# Patient Record
Sex: Female | Born: 1970
Health system: Southern US, Community
[De-identification: ages and names within clinical notes are randomized; demographics above are authoritative.]

## PROBLEM LIST (undated history)

## (undated) DIAGNOSIS — T7840XA Allergy, unspecified, initial encounter: Secondary | ICD-10-CM

## (undated) DIAGNOSIS — K649 Unspecified hemorrhoids: Secondary | ICD-10-CM

## (undated) DIAGNOSIS — F419 Anxiety disorder, unspecified: Secondary | ICD-10-CM

## (undated) DIAGNOSIS — D126 Benign neoplasm of colon, unspecified: Secondary | ICD-10-CM

## (undated) DIAGNOSIS — R002 Palpitations: Secondary | ICD-10-CM

## (undated) HISTORY — PX: POLYPECTOMY: SHX149

## (undated) HISTORY — DX: Benign neoplasm of colon, unspecified: D12.6

## (undated) HISTORY — DX: Palpitations: R00.2

## (undated) HISTORY — PX: COLONOSCOPY: SHX174

## (undated) HISTORY — PX: BREAST BIOPSY: SHX20

## (undated) HISTORY — DX: Allergy, unspecified, initial encounter: T78.40XA

## (undated) HISTORY — PX: HEMORRHOID SURGERY: SHX153

## (undated) HISTORY — DX: Unspecified hemorrhoids: K64.9

## (undated) HISTORY — DX: Anxiety disorder, unspecified: F41.9

## (undated) HISTORY — PX: HEMORROIDECTOMY: SUR656

## (undated) HISTORY — PX: CHOLECYSTECTOMY: SHX55

---

## 2001-05-08 ENCOUNTER — Other Ambulatory Visit: Admission: RE | Admit: 2001-05-08 | Discharge: 2001-05-08 | Payer: Self-pay | Admitting: Obstetrics and Gynecology

## 2002-05-28 ENCOUNTER — Other Ambulatory Visit: Admission: RE | Admit: 2002-05-28 | Discharge: 2002-05-28 | Payer: Self-pay | Admitting: Obstetrics and Gynecology

## 2003-02-03 ENCOUNTER — Inpatient Hospital Stay (HOSPITAL_COMMUNITY): Admission: AD | Admit: 2003-02-03 | Discharge: 2003-02-06 | Payer: Self-pay | Admitting: Obstetrics and Gynecology

## 2003-02-03 ENCOUNTER — Encounter (INDEPENDENT_AMBULATORY_CARE_PROVIDER_SITE_OTHER): Payer: Self-pay

## 2003-02-07 ENCOUNTER — Encounter: Admission: RE | Admit: 2003-02-07 | Discharge: 2003-03-09 | Payer: Self-pay | Admitting: Obstetrics and Gynecology

## 2003-04-20 ENCOUNTER — Other Ambulatory Visit: Admission: RE | Admit: 2003-04-20 | Discharge: 2003-04-20 | Payer: Self-pay | Admitting: Obstetrics and Gynecology

## 2004-05-23 ENCOUNTER — Ambulatory Visit (HOSPITAL_COMMUNITY): Admission: RE | Admit: 2004-05-23 | Discharge: 2004-05-23 | Payer: Self-pay | Admitting: Obstetrics and Gynecology

## 2004-06-29 ENCOUNTER — Other Ambulatory Visit: Admission: RE | Admit: 2004-06-29 | Discharge: 2004-06-29 | Payer: Self-pay | Admitting: Obstetrics and Gynecology

## 2004-09-23 ENCOUNTER — Ambulatory Visit (HOSPITAL_COMMUNITY): Admission: RE | Admit: 2004-09-23 | Discharge: 2004-09-23 | Payer: Self-pay | Admitting: *Deleted

## 2004-09-23 ENCOUNTER — Ambulatory Visit (HOSPITAL_BASED_OUTPATIENT_CLINIC_OR_DEPARTMENT_OTHER): Admission: RE | Admit: 2004-09-23 | Discharge: 2004-09-23 | Payer: Self-pay | Admitting: *Deleted

## 2004-09-23 ENCOUNTER — Encounter (INDEPENDENT_AMBULATORY_CARE_PROVIDER_SITE_OTHER): Payer: Self-pay | Admitting: Specialist

## 2005-09-08 ENCOUNTER — Other Ambulatory Visit: Admission: RE | Admit: 2005-09-08 | Discharge: 2005-09-08 | Payer: Self-pay | Admitting: Obstetrics and Gynecology

## 2008-01-07 ENCOUNTER — Inpatient Hospital Stay (HOSPITAL_COMMUNITY): Admission: AD | Admit: 2008-01-07 | Discharge: 2008-01-09 | Payer: Self-pay | Admitting: Obstetrics & Gynecology

## 2008-01-08 ENCOUNTER — Encounter (INDEPENDENT_AMBULATORY_CARE_PROVIDER_SITE_OTHER): Payer: Self-pay | Admitting: Obstetrics & Gynecology

## 2008-01-09 ENCOUNTER — Encounter (INDEPENDENT_AMBULATORY_CARE_PROVIDER_SITE_OTHER): Payer: Self-pay | Admitting: Obstetrics and Gynecology

## 2008-02-03 ENCOUNTER — Ambulatory Visit (HOSPITAL_COMMUNITY): Admission: RE | Admit: 2008-02-03 | Discharge: 2008-02-03 | Payer: Self-pay | Admitting: Obstetrics and Gynecology

## 2009-02-17 ENCOUNTER — Inpatient Hospital Stay (HOSPITAL_COMMUNITY): Admission: AD | Admit: 2009-02-17 | Discharge: 2009-02-19 | Payer: Self-pay | Admitting: Obstetrics and Gynecology

## 2009-11-30 ENCOUNTER — Encounter: Admission: RE | Admit: 2009-11-30 | Discharge: 2009-11-30 | Payer: Self-pay | Admitting: Gastroenterology

## 2010-06-21 ENCOUNTER — Ambulatory Visit: Payer: Self-pay | Admitting: Family Medicine

## 2010-06-21 DIAGNOSIS — M461 Sacroiliitis, not elsewhere classified: Secondary | ICD-10-CM | POA: Insufficient documentation

## 2010-06-21 DIAGNOSIS — M2170 Unequal limb length (acquired), unspecified site: Secondary | ICD-10-CM | POA: Insufficient documentation

## 2010-06-21 DIAGNOSIS — R300 Dysuria: Secondary | ICD-10-CM | POA: Insufficient documentation

## 2010-06-21 LAB — CONVERTED CEMR LAB
Bilirubin Urine: NEGATIVE
Glucose, Urine, Semiquant: NEGATIVE
Ketones, urine, test strip: NEGATIVE
Nitrite: NEGATIVE
Protein, U semiquant: NEGATIVE
Specific Gravity, Urine: 1.025
Urobilinogen, UA: 0.2
pH: 6

## 2010-06-22 ENCOUNTER — Encounter: Payer: Self-pay | Admitting: Family Medicine

## 2010-06-24 ENCOUNTER — Encounter: Payer: Self-pay | Admitting: Family Medicine

## 2010-08-30 NOTE — Assessment & Plan Note (Signed)
Summary: POSS UTI/TJ (rm 4)   Vital Signs:  Patient Profile:   40 Years Old Female CC:      dysuria x 2days Height:     69 inches Weight:      131.50 pounds O2 Sat:      100 % O2 treatment:    Room Air Temp:     98.6 degrees F oral Pulse rate:   77 / minute BP sitting:   114 / 75  (left arm) Cuff size:   regular  Pt. in pain?   yes    Location:   lower back    Intensity:   7    Type:       sharp  Vitals Entered By: Lajean Saver RN (June 21, 2010 5:27 PM)                   Updated Prior Medication List: TYLENOL 325 MG TABS (ACETAMINOPHEN) PRN  Current Allergies: ! COMPAZINE ! PENICILLIN ! SULFAHistory of Present Illness Chief Complaint: dysuria x 2days History of Present Illness:  Subjective:  Patient complains of recurring non-radiating low back pain for eleven months.  She had an LS spine MRI in April that was negative.  In May she had two gallbladder attacks and an X-ray showed a gallstone.  Over the past week she has had several episodes of abdominal pain that increased her lower back pain.  She is scheduled to have a cholecystectomy in the near future.  She states that a pelvic exam in May was normal.  She has had no recent vaginal discharge or pelvic pain.  Over the past several days she has had mild dysuria.  She does not believe that she has had any fever. Patient to her right SI joint as a source of tenderness  REVIEW OF SYSTEMS Constitutional Symptoms      Denies fever, chills, night sweats, weight loss, weight gain, and fatigue.  Eyes       Denies change in vision, eye pain, eye discharge, glasses, contact lenses, and eye surgery. Ear/Nose/Throat/Mouth       Denies hearing loss/aids, change in hearing, ear pain, ear discharge, dizziness, frequent runny nose, frequent nose bleeds, sinus problems, sore throat, hoarseness, and tooth pain or bleeding.  Respiratory       Denies dry cough, productive cough, wheezing, shortness of breath, asthma, bronchitis,  and emphysema/COPD.  Cardiovascular       Denies murmurs, chest pain, and tires easily with exhertion.    Gastrointestinal       Denies stomach pain, nausea/vomiting, diarrhea, constipation, blood in bowel movements, and indigestion. Genitourniary       Complains of painful urination.      Denies kidney stones and loss of urinary control. Neurological       Denies paralysis, seizures, and fainting/blackouts. Musculoskeletal       Denies muscle pain, joint pain, joint stiffness, decreased range of motion, redness, swelling, muscle weakness, and gout.      Comments: low back pain Skin       Denies bruising, unusual mles/lumps or sores, and hair/skin or nail changes.  Psych       Denies mood changes, temper/anger issues, anxiety/stress, speech problems, depression, and sleep problems. Other Comments: pt c/o of low back pain and painful urination x 2 days. Pt states that she saw surgeon Dr Rayburn Ma last wk for Gallstones to be sch'ed for surgery.   Past History:  Past Medical History: Gallstone  Past Surgical History: DNC-  06/09' Caesarean section Hemorrhoidectomy  Family History: Family History of Arthritis Family History Diabetes 1st degree relative Family History Hypertension  Social History: Never Smoked Alcohol use-no Drug use-no Smoking Status:  never Drug Use:  no   Objective:  Appearance:  Patient appears healthy, stated age, and in no acute distress  Eyes:  Pupils are equal, round, and reactive to light and accomdation.  Extraocular movement is intact.  Conjunctivae are not inflamed.  Mouth:  No lesions Neck:  Supple.  No adenopathy is present.  No thyromegaly is present  Lungs:  Clear to auscultation.  Breath sounds are equal.  Heart:  Regular rate and rhythm without murmurs, rubs, or gallops.  Abdomen:  Nontender without masses or hepatosplenomegaly.  Bowel sounds are present.  No CVA or flank tenderness.   Back:   Good range of motion.  Can heel/toe walk and  squat without difficulty.  No tenderness over lower back.  Straight leg raising test is negative.  Sitting knee extension test is negative.  Strength and sensation in the lower extremities is normal.  Patellar and achilles reflexes are normal.  Extremities:  No edema.  Pedal pulses are full and equal.  No tenderness.  The left leg is approximately 1cm longer than the right by inspection    urinalysis (dipstick):  Trace leuks, trace blood                                                                               Assessment New Problems: UNEQUAL LEG LENGTH (ICD-736.81) SACROILIITIS, LEFT (ICD-720.2) DYSURIA (ICD-788.1) FAMILY HISTORY DIABETES 1ST DEGREE RELATIVE (ICD-V18.0)  BELIEVE THAT PATIENT'S BACK PAIN MAY BE A COMBINATION OF REFERRED PAIN FROM HER GALLBLADDER AND SACROILIAC INFLAMMATION.  Plan New Orders: T-Culture, Urine [[id number]] New Patient Level III [99203] Urinalysis [81003-65000] Planning Comments:   Obtain shoe insert for right shoe.  Begin stretching exercises for sacroiliac pain ([employer] information and instruction patient handout given)  Rule out UTI with urine culture Follow-up with surgeon as scheduled.   The patient and/or caregiver has been counseled thoroughly with regard to medications prescribed including dosage, schedule, interactions, rationale for use, and possible side effects and they verbalize understanding.  Diagnoses and expected course of recovery discussed and will return if not improved as expected or if the condition worsens. Patient and/or caregiver verbalized understanding.   Orders Added: 1)  T-Culture, Urine [[id number]] 2)  New Patient Level III [72536] 3)  Urinalysis [81003-65000]    Laboratory Results   Urine Tests  Date/Time Received: June 21, 2010 6:02 PM  Date/Time Reported: June 21, 2010 6:02 PM   Routine Urinalysis   Color: yellow Appearance: turbid Glucose: negative   (Normal Range: Negative) Bilirubin:  negative   (Normal Range: Negative) Ketone: negative   (Normal Range: Negative) Spec. Gravity: 1.025   (Normal Range: 1.003-1.035) Blood: trace-lysed   (Normal Range: Negative) pH: 6.0   (Normal Range: 5.0-8.0) Protein: negative   (Normal Range: Negative) Urobilinogen: 0.2   (Normal Range: 0-1) Nitrite: negative   (Normal Range: Negative) Leukocyte Esterace: trace   (Normal Range: Negative)

## 2010-08-30 NOTE — Assessment & Plan Note (Signed)
Summary: Followup Call  Notified patient of negative culture results; left message on cell phone Donna Christen MD  June 24, 2010 9:53 AM

## 2010-11-06 LAB — TYPE AND SCREEN
ABO/RH(D): A POS
Antibody Screen: NEGATIVE

## 2010-11-06 LAB — CBC
HCT: 33 % — ABNORMAL LOW (ref 36.0–46.0)
HCT: 36.7 % (ref 36.0–46.0)
Hemoglobin: 11.2 g/dL — ABNORMAL LOW (ref 12.0–15.0)
Hemoglobin: 12.9 g/dL (ref 12.0–15.0)
MCHC: 34.1 g/dL (ref 30.0–36.0)
MCHC: 35.3 g/dL (ref 30.0–36.0)
MCV: 92.2 fL (ref 78.0–100.0)
MCV: 93.3 fL (ref 78.0–100.0)
Platelets: 155 10*3/uL (ref 150–400)
Platelets: 171 10*3/uL (ref 150–400)
RBC: 3.53 MIL/uL — ABNORMAL LOW (ref 3.87–5.11)
RBC: 3.97 MIL/uL (ref 3.87–5.11)
RDW: 13.1 % (ref 11.5–15.5)
RDW: 13.5 % (ref 11.5–15.5)
WBC: 14.9 10*3/uL — ABNORMAL HIGH (ref 4.0–10.5)
WBC: 18 10*3/uL — ABNORMAL HIGH (ref 4.0–10.5)

## 2010-11-06 LAB — RPR: RPR Ser Ql: NONREACTIVE

## 2010-11-06 LAB — ABO/RH: ABO/RH(D): A POS

## 2010-11-06 LAB — GLUCOSE, CAPILLARY: Glucose-Capillary: 87 mg/dL (ref 70–99)

## 2010-12-13 NOTE — Op Note (Signed)
Heather Briggs, Heather Briggs NO.:  [id number]   MEDICAL RECORD NO.:  [id number]           PATIENT TYPE:   LOCATION:                                 FACILITY:   PHYSICIAN:  Duke Salvia. Marcelle Overlie, M.D.DATE OF BIRTH:  03/24/1971   DATE OF PROCEDURE:  01/09/2008  DATE OF DISCHARGE:                               OPERATIVE REPORT   PREOPERATIVE DIAGNOSES:  1. Retained placenta after 17-week spontaneous abortion.  2. Amnionitis.   POSTOPERATIVE DIAGNOSES:  1. Retained placenta after a 17-week spontaneous abortion.  2. Amnionitis.   PROCEDURE:  Dilation and evacuation.   SURGEON:  Duke Salvia. Marcelle Overlie, M.D.   ANESTHESIA:  Spinal.   COMPLICATIONS:  None.   DRAINS:  Foley catheter.   BLOOD LOSS:  300 mL including old blood clot.   SPECIMENS REMOVED:  Products of conception sent to pathology.   PROCEDURE AND FINDINGS:  The patient was taken to the operating room  after an adequate level of spinal anesthetic was obtained.  With the  patient's legs in stirrups, the bladder was drained.  The perineum was  prepped and draped.  A large amount of placenta was noted at the os and  was removed in one piece.  The cervix was amply dilated spontaneously.  Ring forceps were used to grasp the anterior cervical lip.  Uterus was  sounded.  Pitocin was already running at that point.  A #12 suction  curette was then used to curette a moderate amount of tissue.  When no  further tissue could be removed, an oversized curette was then used to  perform curettage revealing the walls to be clean.  There was minimal  bleeding at that point.  Pitocin was continued.  She was given IV  Toradol, and her antibiotics will be continued.  She went to recovery  room in good condition.      Richard M. Marcelle Overlie, M.D.  Electronically Signed     RMH/MEDQ  D:  01/09/2008  T:  01/09/2008  Job:  [id number]

## 2010-12-13 NOTE — Letter (Signed)
February 03, 2008    Michelle L. Vincente Poli, M.D.  [address], [address]  [place], [place] [address]   RE:   Heather Briggs, Heather Briggs  MR#   [id number]  ACC#        [id number]   MFM CONSULTATION REPORT   Dear Dr. Vincente Poli:   Thank you very much for allowing me to see Heather Briggs today in  consultation.  You know her as a 40 year old G3, P1-1-1-1 who is  referred for consultation secondary to her recent pregnancy on January 08, 2008 when she delivered at [redacted] weeks gestation after a febrile illness.  The patient reports that, during that pregnancy, she had complained of  headaches and just general lethargy over the weekend, and then noted a  fever of up to 104 on Monday, and was seen in the office on Tuesday, the  9th, whereby preterm premature ruptured membranes was suspected.  She  was admitted to the hospital and subsequently went on to labor and  deliver on January 08, 2008.  Her discharge revealed that there was  suspicion for premature ruptured membrane and possible amnionitis, and  possible incompetent cervix.  Her past medical history is otherwise  notable for a 37-week Cesarean delivery secondary to cephalopelvic  disproportion in 2004.   Available records from the hospitalization did not confirm any blood  culture or urine positive findings.  Placenta and fetus sent to  pathology.  However, I do not have the report from that final pathology  evaluation.  This is a preconceptional counseling session to address her  potential risks for subsequent pregnancies.   PAST MEDICAL HISTORY:  As noted above.   SURGICAL HISTORY:  Notable for Cesarean delivery, breast augmentation,  and dilatation and curettage for postpartum retained products in the  last pregnancy.   ALLERGIES:  PENICILLIN, SULFA, COMPAZINE, and ANALGESIC PROVIDED WITH  HER LAST DELIVERY.  That was possibly STADOL.   Her medications at this time are none.   PAST GYN HISTORY:  Notable for no STD but cryotherapy for abnormal Pap  smear  while she was in college.   OB HISTORY:  Notable for a full-term Cesarean delivery in 2004 for  cephalopelvic disproportion, and then a first trimester SAB in 2006,  which was a 5 to [redacted] week gestation, and this most recent delivery at 29-  weeks gestation.   SOCIAL HISTORY:  She denies any tobacco, alcohol, or drugs of abuse.  She works as a Clinical cytogeneticist.   FAMILY HISTORY:  Negative for any DES exposure or any other abnormal  history of other members with preterm birth.   PHYSICAL EXAMINATION:  VITAL SIGNS:  Her blood pressure is 136/85, pulse  68, and her weight is 147 pounds.  CONSTITUTIONAL:  She is alert and in no acute distress.  Emotionally,  she says she is dealing well with her most recent loss of the 17-week  pregnancy.   ASSESSMENT AND PLAN:  Preconceptional counseling for this patient who  has recently had a 17-week birth secondary to suspected chorioamnionitis  and PPROM.  1. Given this history of her 17-week preterm birth and her first      trimester spontaneous abortion, I did discuss the possibility of      having a hypercoagulable panel workup, including testing for      anticardiolipin antibodies as well as lupus anticoagulant panel.      In addition to those, we could also evaluate for any of the other  thrombophilia, both acquired as well as hereditary.  However, I      mentioned that the utility in these studies may be very minimal,      given the complexity of her prior presentation.  However, if she      elects to proceed with these studies, what I would recommend is IgG      and IgM for anticardiolipin antibodies, as well as lupus      anticoagulant panel, a protein C and a protein S level, and an      antithrombin 3, beta 2 glycoprotein, Factor V Leiden mutation, and      prothrombin 2 gene mutation studies.  2. Given her prior Cesarean delivery secondary to cephalopelvic      disproportion, there is a possibility that there may be a  component      of cervical insufficiency contributing to this most recent      presentation.  Thus, we had an extensive discussion related to that      possibility of cervical insufficiency and the potential benefit of      a prophylactic cerclage.  It is unclear whether this pregnancy fits      the criteria for cervical insufficiency, given that she had      presumed evidence of chorioamnionitis and subsequently PPROM and      then later labored to deliver.  Overall cervical length was      apparently normal during her hospitalization, per the patient      report.  However, if the patient and the primary physician feel      that prophylactic cerclage may be of benefit, then I would      recommend placing it at 13-weeks gestation.  3. Preterm birth.  Again, although this is not a clear indication for      17-hydroxyprogesterone caproate therapy, given that this is a pre-      viable preterm birth, most likely related to PPROM, one could      consider weekly administration of 17-hydroxyprogesterone in      subsequent pregnancies.  An alternative approach could be      monitoring for cervical length, and if there is shortening and/or      if there is a cerclage placed, with evidence of cervical      shortening, then a nightly vaginal suppository could be      administered.  4. Conception.  Discussed that shortened inter pregnancy intervals do      pose an increased risk for preterm birth in subsequent pregnancies.      Recommend that she consider delaying a pregnancy for at least 4 to      6 months, but this must be weighed against personal preference,      given potential risks related to advanced maternal age.   Answered all the questions that Heather Briggs had during this consultation  session.  Total face-to-face time was 40 minutes during this  consultation.  In summary, we discussed her prior pregnancy, loss at 17  weeks, which was assumed due to chorioamnionitis and ruptured  membranes.  Explained that, this gestation the likelihood of this recurring is  fairly low.  However, given her history of prior Cesarean delivery,  cervical insufficiency is not completely out of the question, and thus,  one option could be to follow serially with transvaginal cervical length  assessment and follow up with either progesterone or cerclage placement  if there is evidence of cervical shortening.  Additionally, if there is  other evidence of abruption, one could consider hypercoagulable panel,  as mentioned above.  Thank you very much for allowing me to participate  in the care of Heather Briggs.  If I can be of any further assistance, or if  you have any additional questions, please feel free to contact me.   Sincerely,      Toma Copier, MD  Assistant Professor, Maternal Fetal Medicine  [facility]  Electronically Signed     SJ/MEDQ  D:  02/03/2008  T:  02/03/2008  Job:  [id number]

## 2010-12-13 NOTE — H&P (Signed)
Heather Briggs, EPPING NO.:  [id number]   MEDICAL RECORD NO.:  [id number]          PATIENT TYPE:  INP   LOCATION:  9172                          FACILITY:  [facility]   PHYSICIAN:  Freddy Finner, M.D.   DATE OF BIRTH:  12-Sep-1970   DATE OF ADMISSION:  01/07/2008  DATE OF DISCHARGE:                              HISTORY & PHYSICAL   ADMISSION DIAGNOSES:  1. Intrauterine pregnancy at 17-1/[redacted] weeks gestation.  2. Febrile illness.  3. Clinical findings consistent with amnionitis.  4. Premature rupture of membranes.   PRESENT ILLNESS:  The patient is a 40 year old white married female,  gravida 3, para 1, AB 1 who is now 17-1/[redacted] weeks gestation and has a 3-  day history of very high fever, generalized aches and right low back  pain, and dry cough.  She was seen in the office today with these  complaints. On careful review of systems, the patient does report the  dry cough, but it is nonproductive. In addition, she does have a history  of increasing watery vaginal discharge which she did not particularly  pay attention to.  She does have some lower abdominal cramping  sensation.  She complains of low back pain near the SI joint in her low  back.  She has continued to take fluids and food.  She reports no GI  symptoms.  No other GU symptoms.   PAST MEDICAL HISTORY:  Recorded in detail in the prenatal summary which  is available in the chart and will not be repeated.  She does have  allergies to PENICILLIN, SULFA and COMPAZINE.   Her only current medications are Tylenol and her prenatal vitamins.   FAMILY HISTORY:  As in the prenatal summary.   PHYSICAL EXAMINATION:  HEENT:  Grossly within normal limits.  GENERAL:  The patient is alert, oriented and cooperative.  VITAL SIGNS:  On examination of the office today, her temperature was  98.8 (has been taking Tylenol all day).  NECK:  Thyroid gland is not palpably enlarged.  CHEST:  Clear to auscultation throughout.  HEART:   Sinus tachycardia with a grade 2/6 systolic murmur, questionable  mid systolic click.  ABDOMEN:  Soft.  There is mild uterine tenderness to deep palpation in  the lower abdomen.  EXTREMITIES:  Without cyanosis, clubbing or edema.  PELVIC EXAMINATION:  The cervix is 1 to 1.5 cm dilated at the internal  os. Cervical length was approximately 3 cm. There is a watery, bloody,  mucoid kind of discharge on vaginal examination.  The uterus itself is  mildly tender.   Urinalysis in the office showed a trace of blood, 1+ ketones, 1+  protein.   ASSESSMENT:  Intrauterine pregnancy at [redacted] weeks gestation, suspicious  for premature rupture of membranes with amniotic fluid leakage and  secondary amnionitis, possible incompetent cervix.   PLAN:  Admission for further management and evaluation including IV  antibiotics, ultrasound, chemistry profiles including a CBC with  differential.      W. Varney Baas, M.D.  Electronically Signed     WRN/MEDQ  D:  01/07/2008  T:  01/07/2008  Job:  [id number]

## 2010-12-13 NOTE — Discharge Summary (Signed)
Heather Briggs, SPERA NO.:  [id number]   MEDICAL RECORD NO.:  [id number]          PATIENT TYPE:  INP   LOCATION:  9311                          FACILITY:  [facility]   PHYSICIAN:  Juluis Mire, M.D.   DATE OF BIRTH:  16-Apr-1971   DATE OF ADMISSION:  01/07/2008  DATE OF DISCHARGE:  01/09/2008                               DISCHARGE SUMMARY   ADMITTING DIAGNOSES:  1. Intrauterine pregnancy at 17 weeks.  2. Maternal fever, question of premature rupture of membranes.  3. Incompetent cervix.   DISCHARGE DIAGNOSIS:  1. Intrauterine pregnancy at 17 weeks.  2. Maternal fever, question of premature rupture of membranes.  3. Incompetent cervix.  4. Subsequent second trimester pregnancy loss.   PROCEDURE:  She did have a D&E after passing the fetus for retained  products.   For a complete history and physical, please see dictated note.   COURSE IN THE HOSPITAL:  The patient was brought in, began on IV  antibiotics in the form of Cleocin and gentamicin.  Ultrasound revealed  subjectively low amniotic fluid.  It was interesting, she had a normal  white count and remained basically afebrile throughout her  hospitalization.  A maternal fetal medicine consultation was obtained  yesterday.  They had discussed with her different options.  She declined  amniocentesis to determine if she was truly ruptured.  Basically, the  plan was to continue support with antibiotics and if she did well, to  try to put a cerclage, and unfortunately on the morning of the January 09, 2008, at 1:30, she passed the fetus.  Subsequently, had a D&E for  retained products.  Following morning, she was afebrile.  Abdomen was  soft.  Fundus was firm.  Her lochia was normal.  Her previous  hemoglobins had been normal, and she decided she wanted to go home.  Her  blood type was A positive.  Her IVs were discontinued at that point.   In terms of complications noted above, the patient was discharged home  in  stable condition.   DISPOSITION:  The patient is instructed an routine management.  She is  to watch for signs of infection, heavy bleeding, or excessive pain.  Discharged home on Cipro as an antibiotic for prophylaxis against  infection.  She will follow up in the office in 1 week.       Juluis Mire, M.D.  Electronically Signed    JSM/MEDQ  D:  01/09/2008  T:  01/09/2008  Job:  [id number]

## 2010-12-16 NOTE — Op Note (Signed)
[name], Heather Briggs                   ACCOUNT NO.:  [id number]   MEDICAL RECORD NO.:  [id number]          PATIENT TYPE:  AMB   LOCATION:  [facility]                         [facility]   PHYSICIAN:  Vikki Ports, MDDATE OF BIRTH:  1971-05-26   DATE OF PROCEDURE:  09/23/2004  DATE OF DISCHARGE:                                 OPERATIVE REPORT   PREOPERATIVE DIAGNOSES:  Internal hemorrhoids with prolapse and anal skin  tag.   POSTOPERATIVE DIAGNOSES:  Internal hemorrhoids with prolapse and anal skin  tag.   PROCEDURE:  PPH and excision of anal skin tag.   SURGEON:  Vikki Ports, MD   ANESTHESIA:  General.   DESCRIPTION OF PROCEDURE:  The patient was taken to the operating room,  placed in the supine position and after adequate general anesthesia was  induced using endotracheal tube, the patient was placed in the prone  jackknife position. Perianal and rectal prep were undertaken.  Anal  dilatation was accomplished to three fingers.  Hemorrhoidal bundles were  injected with 0.5 Marcaine with Wydase. Internal and external sphincter  muscles were injected with 0.5 Marcaine. A 2-0 Prolene pursestring suture  was placed in the rectal mucosa 5 cm proximal to the dentate line. The  stapler was introduced and tied down, closed and fired after 30 seconds of  waiting. Before firing, the vagina was checked. The stapler was then  removed. I had a good donut approximately 2.5 to 3 cm in diameter with no  evidence of muscularis. The staple line was tediously inspected and was  hemostatic.  Gelfoam packing was placed.   An interior anal skin tag was entruncated at its base and closed with a  running locking 3-0 chromic suture. The patient tolerated the procedure well  and went to PACU in good condition.      KRH/MEDQ  D:  09/23/2004  T:  09/23/2004  Job:  [id number]

## 2010-12-16 NOTE — Op Note (Signed)
Heather Briggs, Heather Briggs                               ACCOUNT NO.:  [id number]   MEDICAL RECORD NO.:  [id number]                   PATIENT TYPE:  INP   LOCATION:  9145                                 FACILITY:  [facility]   PHYSICIAN:  Duke Salvia. Marcelle Overlie, M.D.            DATE OF BIRTH:  12-Feb-1971   DATE OF PROCEDURE:  02/03/2003  DATE OF DISCHARGE:                                 OPERATIVE REPORT   PREOPERATIVE DIAGNOSIS:  Cephalopelvic disproportion.   POSTOPERATIVE DIAGNOSES:  1. Cephalopelvic disproportion.  2. Occiput posterior presentation.   PROCEDURE:  Primary low transverse cesarean section.   SURGEON:  Duke Salvia. Marcelle Overlie, M.D.   ANESTHESIA:  Epidural.   COMPLICATIONS:  None.   DRAINS:  Foley catheter.   ESTIMATED BLOOD LOSS:  800.   PROCEDURE AND FINDINGS:  The patient taken to the operating room.  After an  adequate level of epidural anesthesia was obtained with the patient supine  in the left tilt position, the abdomen prepped and draped in the usual  manner for sterile abdominal procedures, a Foley catheter was positioned  draining clear urine.  A Pfannenstiel incision made two fingerbreadths above  the symphysis, carried down to the fascia, which was incised and extended  transversely.  Rectus muscle was divided in the midline, peritoneum entered,  separated without incident, and extended in a vertical manner.  The bladder  blade was inserted.  The bladder flap peritoneum was then incised.  The  bladder was bluntly and sharply dissected below and the bladder blade  repositioned.  A transverse incision made in the lower segment, extended  with blunt dissection.  Clear fluid was noted.  The infant was presenting  straight OP.  The vertex was gently elevated and rotated for easy delivery  of a female, cord pH 7.24, Apgars were 8 and 9, the weight was 8 pounds 2  ounces.  The infant was suctioned and the cord clamped and cut as passed to  the pediatric team for further care.   The placenta was then delivered  manually intact and was sent to pathology.  The uterus was exteriorized, the  cavity wiped clean with a laparotomy pack, closure obtained with a first  layer of 0 chromic in locked fashion, followed by an imbricating layer of 0  chromic.  This was hemostatic.  The bladder flap area was inspected  carefully, noted to be intact and hemostatic at that point.  There were  bilateral filmy adnexal adhesions with the sigmoid colon adherent with filmy  adhesions to the left adnexa, which were lysed in the avascular plane.  Once  this was accomplished on the left, then the right, both tubes and ovaries  appeared to be otherwise normal.  Prior to closure, the sponge, needle, and  instrument counts were reported as correct x2.  The peritoneum closed with a  running 2-0 Dexon suture, rectus muscles approximated  with 2-0 Dexon  interrupted sutures, the fascia closed  transversely with a 0 PDS running suture.  The subcutaneous fat was  hemostatic.  Clips and Steri-Strips used on the skin.  She received Cefotan  1 g IV after the cord was clamped along with Pitocin IV with clear urine  noted at the end of the case.                                               Richard M. Marcelle Overlie, M.D.    RMH/MEDQ  D:  02/03/2003  T:  02/04/2003  Job:  [id number]

## 2010-12-16 NOTE — Discharge Summary (Signed)
Heather Briggs, SHUTTERS NO.:  [id number]   MEDICAL RECORD NO.:  [id number]                   PATIENT TYPE:  INP   LOCATION:  9145                                 FACILITY:  [facility]   PHYSICIAN:  Juluis Mire, M.D.                DATE OF BIRTH:  09/10/1970   DATE OF ADMISSION:  02/03/2003  DATE OF DISCHARGE:  02/06/2003                                 DISCHARGE SUMMARY   ADMISSION DIAGNOSES:  1. Intrauterine pregnancy at 37-5/7 weeks estimated gestational age.  2. Spontaneous rupture of membranes.  3. Active labor.   DISCHARGE DIAGNOSES:  1. Status post low transverse cesarean section secondary to cephalopelvic     disproportion and occiput posterior.  2. Viable female infant.   PROCEDURE:  Primary low transverse cesarean section.   REASON FOR ADMISSION:  Please see written H&P.   HOSPITAL COURSE:  The patient was a 40 year old primigravida that was  admitted to [facility] at 37-5/7 weeks estimated  gestational age with spontaneous rupture of membranes with clear fluid and  spontaneous onset of labor.  Pregnancy had been uncomplicated.  Fetal heart  tones were reactive.  Cervix at the time of admission was 4 cm dilated, 100%  effaced with vertex presentation.  Epidural was placed for the patient's  comfort.  The patient did progress to complete dilation.  After pushing x3  hours without descent, decision was made to proceed with the cesarean  delivery.  The patient was then transferred to the operating room, epidural  was dosed to an adequate surgical level.  A low transverse incision was made  with delivery of a viable female infant weighing 8 pounds 2 ounces with Apgars  of 8 at 1 minute and 9 at 5 minutes.  Umbilical cord pH was 7.24.  The  patient tolerated the procedure well and was taken to the recovery room in  stable condition.  On postoperative day #1, the patient had good return of  bowel function and vital signs were stable,  she was afebrile.  Abdominal  dressing was clean, dry, and intact.  Fundus was firm and nontender.  Labs  revealed a hemoglobin of 12.1, platelet count of 191,000, WBC count of 19.2.  On postoperative day #2, the patient was without complaint.  She was  ambulating well, tolerating a regular diet without complaints of nausea and  vomiting.  Abdominal dressing was removed revealing an incision that was  clean, dry, and intact.  On postoperative day #3, vital signs were stable.  The patient remained afebrile.  The patient was doing well and discharge  instructions were given and the patient was discharged home.   CONDITION ON DISCHARGE:  Good.   DIET:  Regular as tolerated.   ACTIVITY:  No heavy lifting, no driving x2 weeks, no vaginal entry.   FOLLOW UP:  The patient is to  follow up in the office in 7-10 days for an  incision check.  She should call for a temperature greater than 100 degrees,  persistent nausea and vomiting, heavy vaginal bleeding, and/or redness or  drainage from her incision site.    DISCHARGE MEDICATIONS:  1. Tylox, dispense #30, one p.o. q.4-6 h. p.r.n.  2. Motrin 600 mg every 6 hours p.r.n.  3. Prenatal vitamins one p.o. daily.  4. Colace one p.o. daily p.r.n.     Heather Briggs, N.P.                        Juluis Mire, M.D.    CC/MEDQ  D:  03/06/2003  T:  03/06/2003  Job:  [id number]

## 2011-01-23 IMAGING — US US ABDOMEN COMPLETE
1 series · 14 of 25 positions shown · non-contrast
Comparison: None.

CLINICAL DATA: Right upper quadrant abdominal pain

COMPLETE ABDOMINAL ULTRASOUND

[Series 1: us abdomen complete · 0.20mm/px · 14 of 76 slices shown]
[im 1/76]
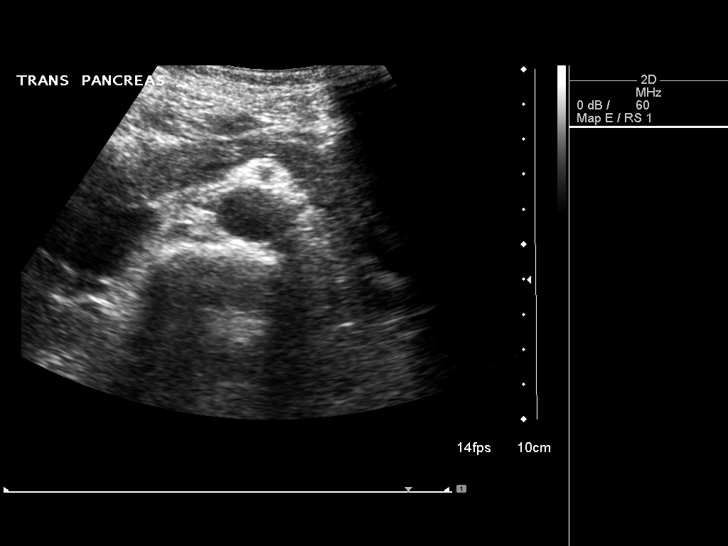
[im 7/76]
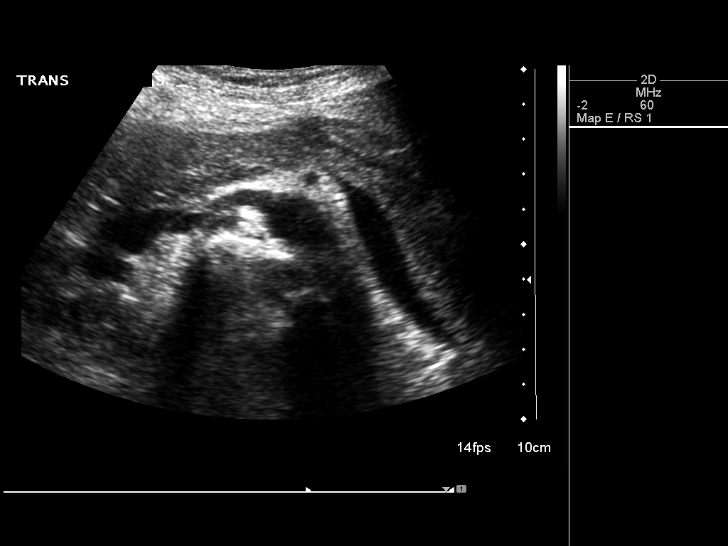
[im 13/76]
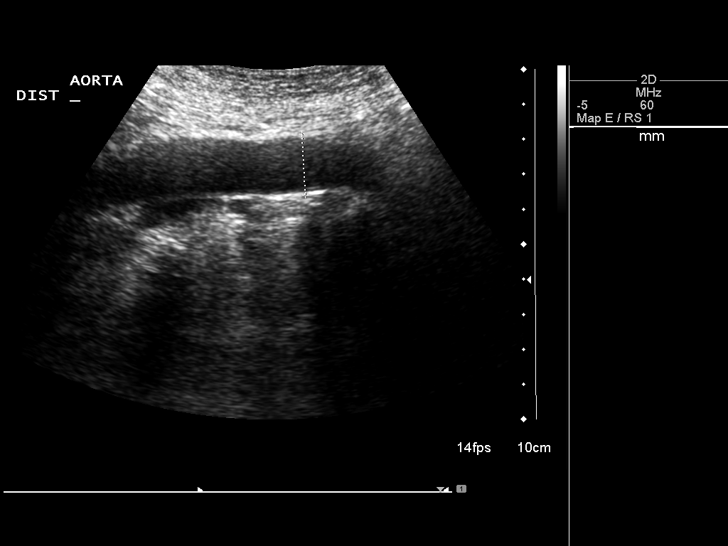
[im 19/76]
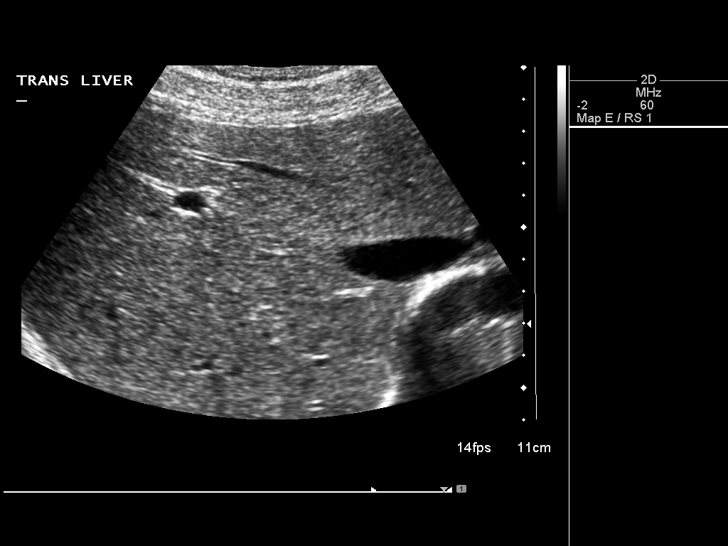
[im 26/76]
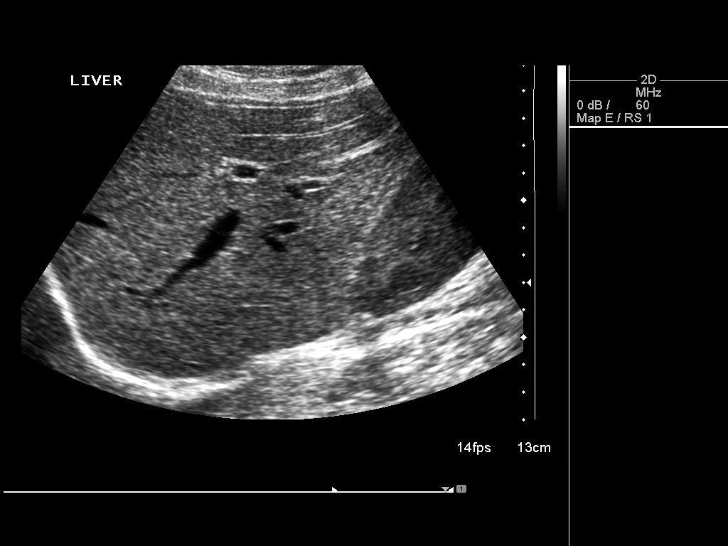
[im 29/76]
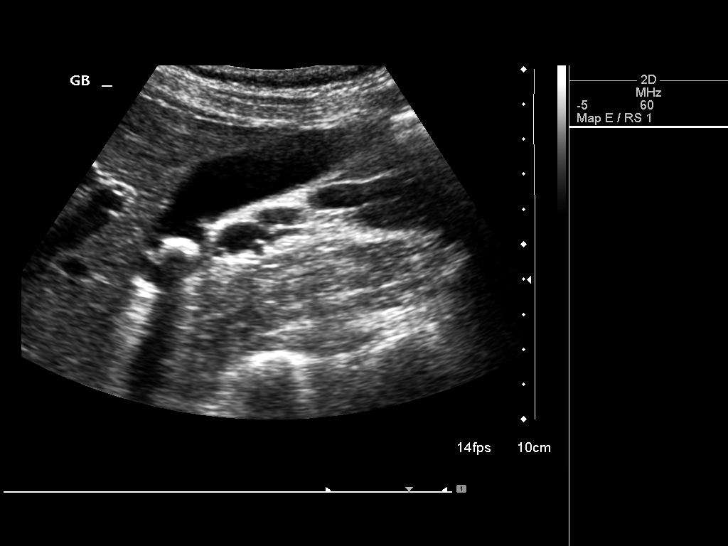
[im 35/76]
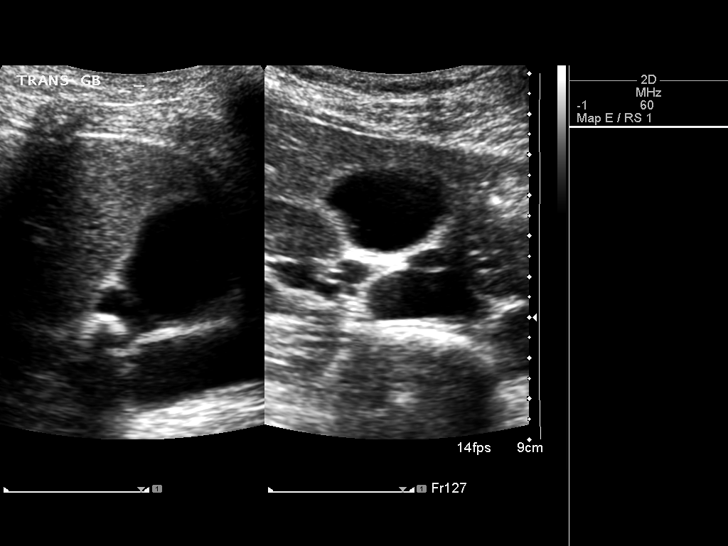
[im 41/76]
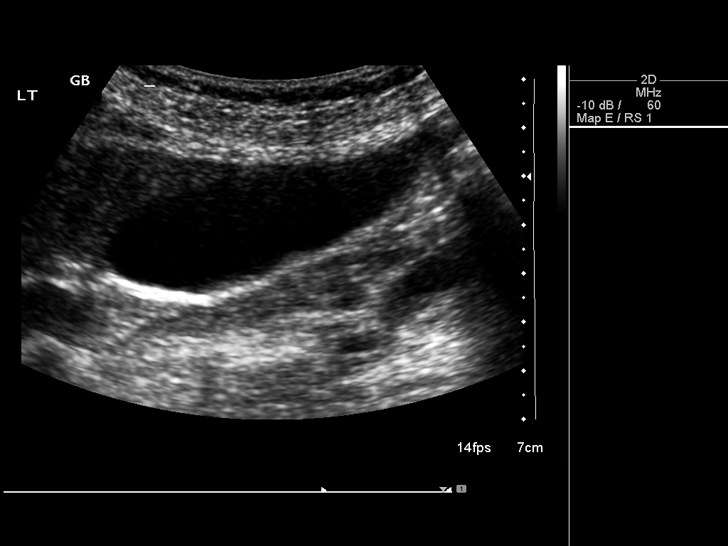
[im 47/76]
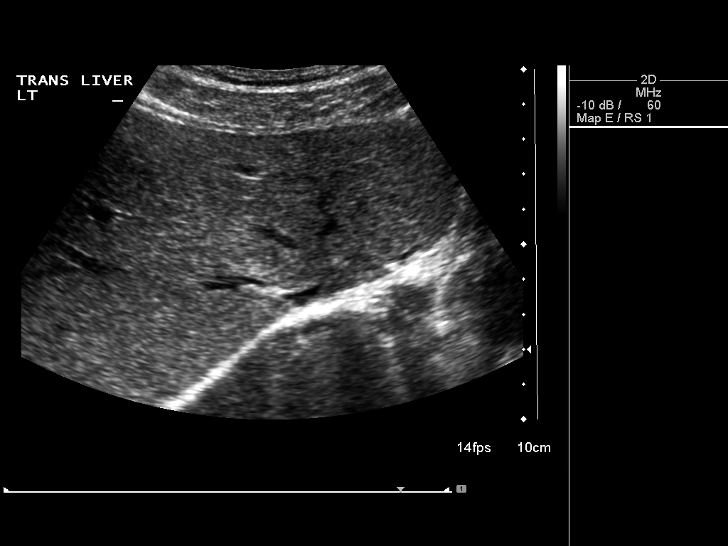
[im 51/76]
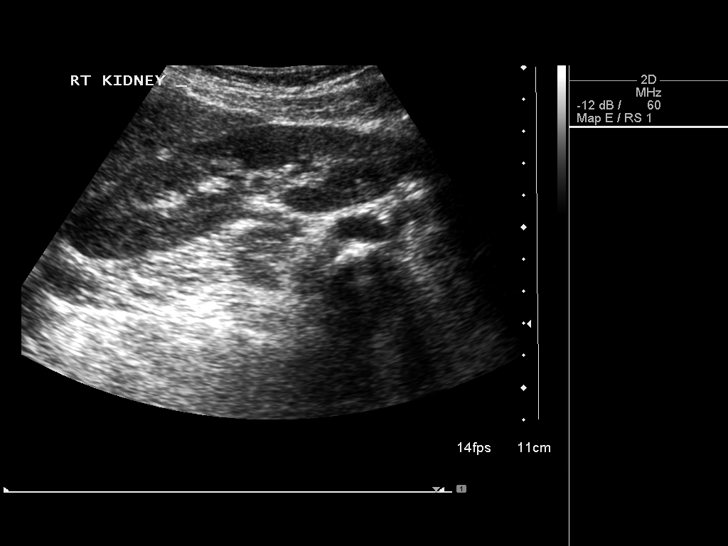
[im 57/76]
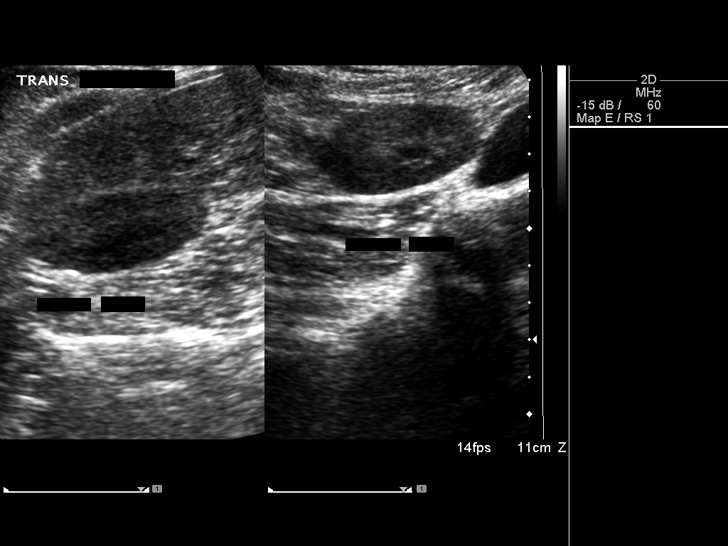
[im 63/76]
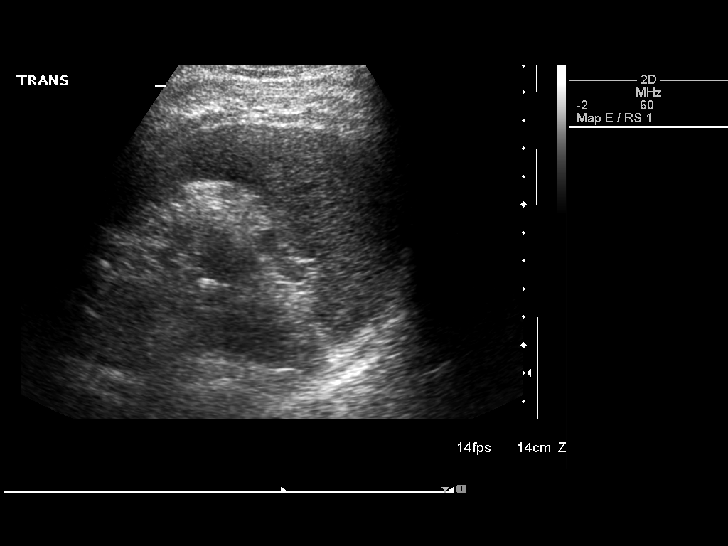
[im 69/76]
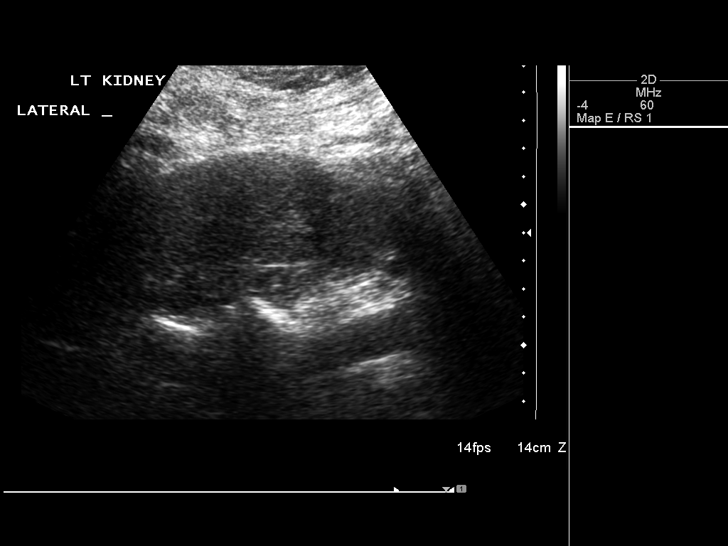
[im 76/76]
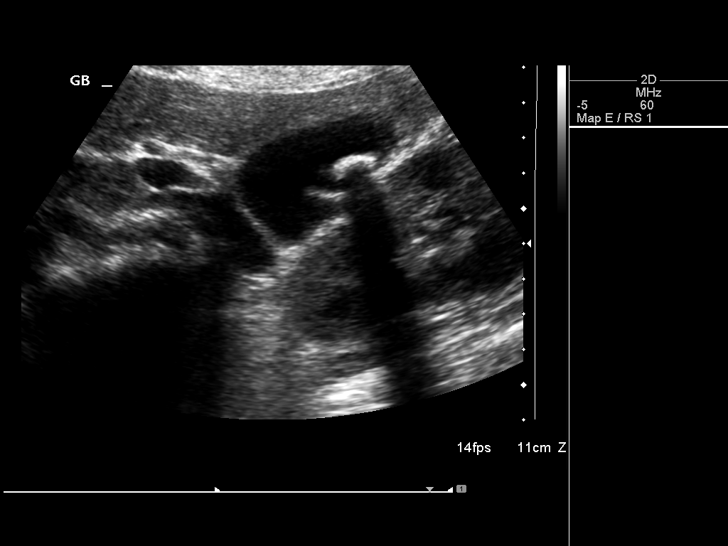

[14 of 25 positions shown; findings below may reference images not displayed]

FINDINGS: Gallbladder:  A single mobile gallstone is noted of 11 mm in
diameter with shadowing.  No gallbladder wall thickening is seen
and there is no pain over the gallbladder.

Common bile duct:  The common bile duct is normal measuring 2.6 mm
in diameter.

Liver:  The liver has a normal echogenic pattern.  No ductal
dilatation is seen.

IVC:  Appears normal.

Pancreas:  No focal abnormality seen.

Spleen:  The spleen is normal measuring 8.7 cm sagittally.

Right Kidney:  No hydronephrosis is noted.  The right kidney
measures 10.9 cm sagittally.

Left Kidney:  No hydronephrosis.  The left kidney measures 11.8 cm.

Abdominal aorta:  The abdominal aorta is normal in caliber.
IMPRESSION: 1.  Single mobile gallstone of 11 mm in diameter.  No pain over the
gallbladder.
2.  No ductal dilatation.

## 2011-04-27 LAB — DIFFERENTIAL
Basophils Absolute: 0
Basophils Absolute: 0
Basophils Relative: 0
Basophils Relative: 0
Eosinophils Absolute: 0
Eosinophils Absolute: 0
Eosinophils Relative: 0
Eosinophils Relative: 0
Lymphocytes Relative: 11 — ABNORMAL LOW
Lymphocytes Relative: 8 — ABNORMAL LOW
Lymphs Abs: 0.6 — ABNORMAL LOW
Lymphs Abs: 0.8
Monocytes Absolute: 0.9
Monocytes Absolute: 1
Monocytes Relative: 12
Monocytes Relative: 13 — ABNORMAL HIGH
Neutro Abs: 5.9
Neutro Abs: 6
Neutrophils Relative %: 77
Neutrophils Relative %: 78 — ABNORMAL HIGH

## 2011-04-27 LAB — CBC
HCT: 29.8 — ABNORMAL LOW
HCT: 33.7 — ABNORMAL LOW
Hemoglobin: 10.6 — ABNORMAL LOW
Hemoglobin: 11.8 — ABNORMAL LOW
MCHC: 35.1
MCHC: 35.7
MCHC: 35.7
MCV: 89.7
MCV: 90
MCV: 90.2
Platelets: 116 — ABNORMAL LOW
Platelets: 146 — ABNORMAL LOW
Platelets: 157
RBC: 2.68 — ABNORMAL LOW
RBC: 3.32 — ABNORMAL LOW
RBC: 3.75 — ABNORMAL LOW
RDW: 13.2
RDW: 13.4
RDW: 13.8
WBC: 7.7
WBC: 7.7

## 2011-04-27 LAB — CMV ABS, IGG+IGM (CYTOMEGALOVIRUS): CMV IgM: <0.9 Index (ref <0.90)

## 2011-04-27 LAB — URIC ACID: Uric Acid, Serum: 2.7

## 2011-04-27 LAB — COMPREHENSIVE METABOLIC PANEL
ALT: 17
AST: 31
Albumin: 2.7 — ABNORMAL LOW
Alkaline Phosphatase: 59
BUN: 3 — ABNORMAL LOW
CO2: 27
Calcium: 8.9
Chloride: 99
Creatinine, Ser: 0.47
GFR calc Af Amer: >60
GFR calc non Af Amer: >60
Glucose, Bld: 108 — ABNORMAL HIGH
Potassium: 4.3
Sodium: 133 — ABNORMAL LOW
Total Bilirubin: 0.8
Total Protein: 5.9 — ABNORMAL LOW

## 2011-04-27 LAB — LACTATE DEHYDROGENASE: LDH: 208

## 2011-04-27 LAB — URINE CULTURE
Colony Count: NO GROWTH
Culture: NO GROWTH
Special Requests: NEGATIVE

## 2011-04-27 LAB — URINALYSIS, ROUTINE W REFLEX MICROSCOPIC
Bilirubin Urine: NEGATIVE
Glucose, UA: NEGATIVE
Hgb urine dipstick: NEGATIVE
Ketones, ur: NEGATIVE
Nitrite: NEGATIVE
Protein, ur: NEGATIVE
Specific Gravity, Urine: <1.005 — ABNORMAL LOW
Urobilinogen, UA: 0.2
pH: 6

## 2011-04-27 LAB — CULTURE, BLOOD (ROUTINE X 2)
Culture: NO GROWTH
Culture: NO GROWTH

## 2011-04-27 LAB — INFLUENZA A+B VIRUS AG-DIRECT(RAPID)
Inflenza A Ag: NEGATIVE
Influenza B Ag: NEGATIVE

## 2011-10-24 ENCOUNTER — Encounter: Payer: Self-pay | Admitting: *Deleted

## 2012-02-18 ENCOUNTER — Emergency Department
Admission: EM | Admit: 2012-02-18 | Discharge: 2012-02-18 | Disposition: A | Discharge status: Home / Self Care | Payer: Self-pay | Source: Home / Self Care | Attending: Family Medicine | Admitting: Family Medicine

## 2012-02-18 DIAGNOSIS — R300 Dysuria: Secondary | ICD-10-CM

## 2012-02-18 LAB — POCT URINALYSIS DIP (MANUAL ENTRY)
Blood, UA: NEGATIVE
Glucose, UA: 250
Spec Grav, UA: 1.01 (ref 1.005–1.03)

## 2012-02-18 MED ORDER — FLUCONAZOLE 150 MG PO TABS: 150.0000 mg | ORAL_TABLET | Freq: Once | ORAL | Status: AC

## 2012-02-18 MED ORDER — NITROFURANTOIN MONOHYD MACRO 100 MG PO CAPS: 100.0000 mg | ORAL_CAPSULE | Freq: Two times a day (BID) | ORAL | Status: AC

## 2012-02-18 NOTE — ED Notes (Signed)
Heather Briggs complains of burning during urination for a couple of weeks. She was seen at [facility] on July 9th for the same symptoms and was treated with Cipro and Phenazopyridine. The pain and pressure did stop for a week only to return. Now she has low back pain, pressure and burning during urination. She states the pain continues even hours after urination. The Cipro gave her a headache, increased heart rate, and increased anxiety.

## 2012-02-18 NOTE — ED Provider Notes (Signed)
History     CSN: [id number]  Arrival date & time 02/18/12  1215   First MD Initiated Contact with Patient 02/18/12 1238      Chief Complaint  Patient presents with  . Dysuria    couple weeks      HPI Comments: Heather Briggs complains of burning during urination for a couple of weeks. She was seen at [facility] July [facility]th for the same symptoms and was treated with Cipro and Phenazopyridine. The pain and pressure did stop for a week only to return. Now she has low back pain, pressure and burning during urination. She states the pain continues even hours after urination. The Cipro gave her a headache, increased heart rate, and increased anxiety.  Patient's last menstrual period was 01/23/2012.   Patient is a 41 y.o. female presenting with dysuria. The history is provided by the patient.  Dysuria  This is a recurrent problem. The current episode started more than 2 days ago. The problem occurs every urination. The problem has been gradually worsening. The quality of the pain is described as burning. The pain is mild. There has been no fever. Associated symptoms include frequency, hesitancy and urgency. Pertinent negatives include no chills, no sweats, no nausea, no vomiting, no discharge, no hematuria and no flank pain. She has tried antibiotics for the symptoms.    Past Medical History  Diagnosis Date  . Heart palpitations     typically occur at bedtime  . Anxiety     Past Surgical History  Procedure Date  . Cholecystectomy   . Cesarean section   . Hemorroidectomy     Family History  Problem Relation Age of Onset  . Hypertension Mother   . Hyperlipidemia Mother   . Hypertension Father   . Diabetes Father 53    History  Substance Use Topics  . Smoking status: Never Smoker   . Smokeless tobacco: Never Used  . Alcohol Use: No    OB History    Grav Para Term Preterm Abortions TAB SAB Ect Mult Living                  Review of Systems  Constitutional: Negative for  chills.  Gastrointestinal: Negative for nausea and vomiting.  Genitourinary: Positive for dysuria, hesitancy, urgency and frequency. Negative for hematuria and flank pain.  All other systems reviewed and are negative.    Allergies  Penicillins; Prochlorperazine edisylate; Sulfonamide derivatives; and Ciprocin-fluocin-procin  Home Medications   Current Outpatient Rx  Name Route Sig Dispense Refill  . B COMPLEX PO TABS Oral Take 1 tablet by mouth daily.    Marland Kitchen MAGNESIUM PO Oral Take 1 tablet by mouth as needed.    Marland Kitchen PHENAZOPYRIDINE HCL 200 MG PO TABS Oral Take 200 mg by mouth 3 (three) times daily as needed.    Marland Kitchen PROBIOTIC DAILY PO Oral Take by mouth.    Marland Kitchen FLUCONAZOLE 150 MG PO TABS Oral Take 1 tablet (150 mg total) by mouth once. 1 tablet 1  . NITROFURANTOIN MONOHYD MACRO 100 MG PO CAPS Oral Take 1 capsule (100 mg total) by mouth 2 (two) times daily. 14 capsule 0    BP 128/83  Pulse 83  Temp 98.3 F (36.8 C) (Oral)  Resp 17  Ht 5' 9.75" (1.772 m)  Wt 134 lb (60.782 kg)  BMI 19.37 kg/m2  SpO2 100%  LMP 01/23/2012  Physical Exam Nursing notes and Vital Signs reviewed. Appearance:  Patient appears healthy, stated age, and in  no acute distress Eyes:  Pupils are equal, round, and reactive to light and accomodation.  Extraocular movement is intact.  Conjunctivae are not inflamed  Mouth/Pharynx:  Normal; moist mucous membranes  Neck:  Supple.  No adenopathy Lungs:  Clear to auscultation.  Breath sounds are equal.  Heart:  Regular rate and rhythm without murmurs, rubs, or gallops.  Abdomen:  Nontender without masses or hepatosplenomegaly.  Bowel sounds are present.  No CVA or flank tenderness.  Extremities:  No edema.  No calf tenderness Skin:  No rash present.   ED Course  Procedures none   Labs Reviewed  POCT URINALYSIS DIP (MANUAL ENTRY) - Abnormal; Notable for the following:   GLUC 250 mg/dL, BIL moderate, KET 15 mg/dL, PROT 100mg /dL, URO 4.0 E.U./dL, NIT positive, LEUK  large  URINE CULTURE pending   Narrative:    Performed at:  [facility]                [address]                [place], [place] [address] URINE      1. Dysuria; suspect cystitis       MDM  Urine culture pending. Begin Macrobid. Continue increased fluid intake.  May take Pyridium for one or two doses. Begin Diflucan if vaginal yeast infection develops. Followup with Family Doctor if not improved in one week.         Lattie Haw, MD 02/20/12 [phone]

## 2012-02-20 LAB — URINE CULTURE: Organism ID, Bacteria: NO GROWTH

## 2012-02-23 ENCOUNTER — Telehealth: Payer: Self-pay | Admitting: *Deleted

## 2013-05-27 DIAGNOSIS — R5381 Other malaise: Secondary | ICD-10-CM | POA: Insufficient documentation

## 2013-05-27 DIAGNOSIS — R002 Palpitations: Secondary | ICD-10-CM | POA: Insufficient documentation

## 2013-05-27 DIAGNOSIS — R634 Abnormal weight loss: Secondary | ICD-10-CM | POA: Insufficient documentation

## 2013-05-27 DIAGNOSIS — J309 Allergic rhinitis, unspecified: Secondary | ICD-10-CM | POA: Insufficient documentation

## 2013-05-27 DIAGNOSIS — IMO0001 Reserved for inherently not codable concepts without codable children: Secondary | ICD-10-CM | POA: Insufficient documentation

## 2013-05-27 DIAGNOSIS — I781 Nevus, non-neoplastic: Secondary | ICD-10-CM | POA: Insufficient documentation

## 2013-05-27 DIAGNOSIS — R3915 Urgency of urination: Secondary | ICD-10-CM | POA: Insufficient documentation

## 2013-05-27 DIAGNOSIS — J069 Acute upper respiratory infection, unspecified: Secondary | ICD-10-CM | POA: Insufficient documentation

## 2013-05-27 DIAGNOSIS — R9431 Abnormal electrocardiogram [ECG] [EKG]: Secondary | ICD-10-CM | POA: Insufficient documentation

## 2013-05-27 DIAGNOSIS — R42 Dizziness and giddiness: Secondary | ICD-10-CM | POA: Insufficient documentation

## 2013-05-27 DIAGNOSIS — IMO0002 Reserved for concepts with insufficient information to code with codable children: Secondary | ICD-10-CM | POA: Insufficient documentation

## 2013-05-27 DIAGNOSIS — L299 Pruritus, unspecified: Secondary | ICD-10-CM | POA: Insufficient documentation

## 2013-06-06 ENCOUNTER — Encounter: Payer: Self-pay | Admitting: Internal Medicine

## 2013-07-02 ENCOUNTER — Encounter: Payer: Self-pay | Admitting: Internal Medicine

## 2013-07-03 ENCOUNTER — Other Ambulatory Visit: Payer: Self-pay | Admitting: Radiology

## 2013-07-04 ENCOUNTER — Encounter: Payer: Self-pay | Admitting: Internal Medicine

## 2013-07-04 ENCOUNTER — Ambulatory Visit (INDEPENDENT_AMBULATORY_CARE_PROVIDER_SITE_OTHER): Payer: BC Managed Care – PPO | Admitting: Internal Medicine

## 2013-07-04 ENCOUNTER — Other Ambulatory Visit (INDEPENDENT_AMBULATORY_CARE_PROVIDER_SITE_OTHER): Payer: BC Managed Care – PPO

## 2013-07-04 VITALS — BP 130/70 | HR 62 | Ht 69.5 in | Wt 136.0 lb

## 2013-07-04 DIAGNOSIS — R1032 Left lower quadrant pain: Secondary | ICD-10-CM

## 2013-07-04 DIAGNOSIS — R194 Change in bowel habit: Secondary | ICD-10-CM

## 2013-07-04 DIAGNOSIS — K5900 Constipation, unspecified: Secondary | ICD-10-CM

## 2013-07-04 DIAGNOSIS — R198 Other specified symptoms and signs involving the digestive system and abdomen: Secondary | ICD-10-CM

## 2013-07-04 LAB — COMPREHENSIVE METABOLIC PANEL
AST: 17 U/L (ref 0–37)
Albumin: 4.4 g/dL (ref 3.5–5.2)
BUN: 16 mg/dL (ref 6–23)
Calcium: 9.2 mg/dL (ref 8.4–10.5)
Chloride: 104 mEq/L (ref 96–112)
Glucose, Bld: 87 mg/dL (ref 70–99)
Potassium: 4.8 mEq/L (ref 3.5–5.1)

## 2013-07-04 LAB — CBC
MCHC: 33.6 g/dL (ref 30.0–36.0)
Platelets: 228 10*3/uL (ref 150.0–400.0)
RBC: 4.8 Mil/uL (ref 3.87–5.11)
WBC: 7.2 10*3/uL (ref 4.5–10.5)

## 2013-07-04 LAB — IGA: IgA: 349 mg/dL (ref 68–378)

## 2013-07-04 LAB — TSH: TSH: 0.89 u[IU]/mL (ref 0.35–5.50)

## 2013-07-04 MED ORDER — HYOSCYAMINE SULFATE 0.125 MG SL SUBL: 0.1250 mg | SUBLINGUAL_TABLET | SUBLINGUAL | Status: DC | PRN

## 2013-07-04 NOTE — Progress Notes (Signed)
Patient ID: Heather Briggs, female   DOB: 23-Jul-1971, 42 y.o.   MRN: [id number] HPI: Heather Briggs is a 42 yo female with PMH of heart palpitations and anxiety who is seen for evaluation of change in bowel habits and left lower quadrant abdominal pain. She is here alone today. She reports her bowel habit seemed to change around February 2014. During this time she became more constipated and felt like she needed to strain at stool. She also reports that leaning to the left seems to help pass her stools. Prior to this she was having about one bowel movement per day but now she is having variable bowel movements often every 3-4 days. She reports feeling like "there is something stuck in there" and at times she sees "pellet-like" stools.  She describes a burning and aching left lower quadrant abdominal pain which is fairly constant. At times it even seems worse after a bowel movement. This is caused significant anxiety for her. She seen no blood in her stool or melena. She did feel the burning type pain back in April but it subsided for several months and recurred in late October. It has been fairly constant since that time. In April she had a GYN evaluation because they felt like this may be pelvic/GYN pain and she recalls a pelvic and transvaginal ultrasound. She was told she had a fibroid. Mitchell cycles have been fairly regular for her. She does have cramping with menstrual cycles but this burning pain does not feel similar. No upper abdominal symptoms including no nausea or vomiting.  Rare and occasional heartburn at night. No dysphagia or odynophagia. She does report about 5-10 pound weight loss which she feels may be secondary to being worried and anxious. She denies bloating but does feel "gassy".  She recently had a breast lump biopsy after a mammogram. Biopsy results are pending.  Past Medical History  Diagnosis Date  . Heart palpitations     typically occur at bedtime  . Anxiety     Past Surgical History   Procedure Laterality Date  . Cholecystectomy    . Cesarean section  2004  . Hemorroidectomy      Current Outpatient Prescriptions  Medication Sig Dispense Refill  . hyoscyamine (LEVSIN/SL) 0.125 MG SL tablet Place 1 tablet (0.125 mg total) under the tongue every 4 (four) hours as needed.  30 tablet  0   No current facility-administered medications for this visit.    Allergies  Allergen Reactions  . Penicillins   . Prochlorperazine Edisylate   . Sulfonamide Derivatives   . Ciprocin-Fluocin-Procin [Fluocinolone] Anxiety    Family History  Problem Relation Age of Onset  . Hypertension Mother   . Hyperlipidemia Mother   . Hypertension Father   . Diabetes Father 59  . Colon polyps Mother     benign  . Heart disease Father   . Colon cancer Neg Hx     History  Substance Use Topics  . Smoking status: Former Smoker    Types: Cigarettes  . Smokeless tobacco: Never Used  . Alcohol Use: Yes     Comment: social    ROS: As per history of present illness, otherwise negative  BP 130/70  Pulse 62  Ht 5' 9.5" (1.765 m)  Wt 136 lb (61.689 kg)  BMI 19.80 kg/m2  LMP 06/17/2013 Constitutional: Well-developed and well-nourished. No distress. HEENT: Normocephalic and atraumatic. Oropharynx is clear and moist. No oropharyngeal exudate. Conjunctivae are normal.  No scleral icterus. Neck: Neck supple. Trachea midline.  Cardiovascular: Normal rate, regular rhythm and intact distal pulses. No M/R/G Pulmonary/chest: Effort normal and breath sounds normal. No wheezing, rales or rhonchi. Abdominal: Soft, nontender, nondistended. Bowel sounds active throughout. There are no masses palpable. No hepatosplenomegaly. Extremities: no clubbing, cyanosis, or edema Lymphadenopathy: No cervical adenopathy noted. Neurological: Alert and oriented to person place and time. Skin: Skin is warm and dry. No rashes noted. Psychiatric: Normal mood and affect. Behavior is normal.   ASSESSMENT/PLAN: 42  yo female with PMH of heart palpitations and anxiety who is seen for evaluation of change in bowel habits and left lower quadrant abdominal pain.  1.  Change in bowel habits/constipation/LLQ pain -- some of her symptoms seem irritable in nature, though she is worried about a more ominous cause such as colon cancer. I have recommended colonoscopy given her change in bowel habits and persistent left lower quadrant pain. There is no overt tenderness on examination of the left lower quadrant today and thus I think diverticulitis is very unlikely. I would like to check labs to include CBC, CMP, TSH, celiac panel and ESR. I will also start her on Linzess 145 mcg daily to try to help with constipation. We discussed the side effect of diarrhea and she will call me if this occurs. I will also give her prescription for Levsin to be used as needed and as directed for lower abdominal pain/spasm. Further recommendations after colonoscopy.  2.  Uterine fibroid -- it is certainly possible that uterine fibroid is contributing to some of her pelvic pain, though a colonic source needs to be excluded first. She has followup with her GYN doctor.

## 2013-07-04 NOTE — Patient Instructions (Signed)
You have been scheduled for a colonoscopy with propofol. Please follow written instructions given to you at your visit today.  Please pick up your prep kit at the pharmacy within the next 1-3 days. If you use inhalers (even only as needed), please bring them with you on the day of your procedure. Your physician has requested that you go to www.startemmi.com and enter the access code given to you at your visit today. This web site gives a general overview about your procedure. However, you should still follow specific instructions given to you by our office regarding your preparation for the procedure.  Your physician has requested that you go to the basement for the following lab work before leaving today: CBC, CMP, Celiac, TSH, ESR                                               We are excited to introduce MyChart, a new best-in-class service that provides you online access to important information in your electronic medical record. We want to make it easier for you to view your health information - all in one secure location - when and where you need it. We expect MyChart will enhance the quality of care and service we provide.  When you register for MyChart, you can:    View your test results.    Request appointments and receive appointment reminders via email.    Request medication renewals.    View your medical history, allergies, medications and immunizations.    Communicate with your physician's office through a password-protected site.    Conveniently print information such as your medication lists.  To find out if MyChart is right for you, please talk to a member of our clinical staff today. We will gladly answer your questions about this free health and wellness tool.  If you are age 39 or older and want a member of your family to have access to your record, you must provide written consent by completing a proxy form available at our office. Please speak to our clinical staff about  guidelines regarding accounts for patients younger than age 78.  As you activate your MyChart account and need any technical assistance, please call the MyChart technical support line at [phone]-CHART [phone]) or email your question to [email or web address]. If you email your question(s), please include your name, a return phone number and the best time to reach you.  If you have non-urgent health-related questions, you can send a message to our office through MyChart at [email or web address]. If you have a medical emergency, call 911.  Thank you for using MyChart as your new health and wellness resource!   MyChart licensed from [employer],  4540-9811. Patents Pending.

## 2013-08-07 ENCOUNTER — Other Ambulatory Visit: Payer: Self-pay | Admitting: Obstetrics and Gynecology

## 2013-08-22 ENCOUNTER — Telehealth: Payer: Self-pay | Admitting: Internal Medicine

## 2013-08-22 MED ORDER — SOD PICOSULFATE-MAG OX-CIT ACD 10-3.5-12 MG-GM-GM PO PACK: 1.0000 | PACK | Freq: Once | ORAL | Status: DC

## 2013-08-22 NOTE — Telephone Encounter (Signed)
prepopik sent to pts pharmacy

## 2013-08-28 ENCOUNTER — Ambulatory Visit (AMBULATORY_SURGERY_CENTER): Payer: BC Managed Care – PPO | Admitting: Internal Medicine

## 2013-08-28 ENCOUNTER — Encounter: Payer: Self-pay | Admitting: Internal Medicine

## 2013-08-28 VITALS — BP 109/44 | HR 69 | Temp 97.6°F | Resp 22 | Ht 69.5 in | Wt 136.0 lb

## 2013-08-28 DIAGNOSIS — K5900 Constipation, unspecified: Secondary | ICD-10-CM

## 2013-08-28 DIAGNOSIS — R198 Other specified symptoms and signs involving the digestive system and abdomen: Secondary | ICD-10-CM

## 2013-08-28 DIAGNOSIS — D126 Benign neoplasm of colon, unspecified: Secondary | ICD-10-CM

## 2013-08-28 DIAGNOSIS — R1032 Left lower quadrant pain: Secondary | ICD-10-CM

## 2013-08-28 MED ORDER — SODIUM CHLORIDE 0.9 % IV SOLN: 500.0000 mL | INTRAVENOUS | Status: DC

## 2013-08-28 NOTE — Progress Notes (Signed)
Called to room to assist during endoscopic procedure.  Patient ID and intended procedure confirmed with present staff. Received instructions for my participation in the procedure from the performing physician.  

## 2013-08-28 NOTE — Progress Notes (Signed)
Patient did not experience any of the following events: a burn prior to discharge; a fall within the facility; wrong site/side/patient/procedure/implant event; or a hospital transfer or hospital admission upon discharge from the facility. (G8907) Patient did not have preoperative order for IV antibiotic SSI prophylaxis. (G8918)  

## 2013-08-28 NOTE — Patient Instructions (Signed)
YOU HAD AN ENDOSCOPIC PROCEDURE TODAY AT [facility]: Refer to the procedure report that was given to you for any specific questions about what was found during the examination.  If the procedure report does not answer your questions, please call your gastroenterologist to clarify.  If you requested that your care partner not be given the details of your procedure findings, then the procedure report has been included in a sealed envelope for you to review at your convenience later.  YOU SHOULD EXPECT: Some feelings of bloating in the abdomen. Passage of more gas than usual.  Walking can help get rid of the air that was put into your GI tract during the procedure and reduce the bloating. If you had a lower endoscopy (such as a colonoscopy or flexible sigmoidoscopy) you may notice spotting of blood in your stool or on the toilet paper. If you underwent a bowel prep for your procedure, then you may not have a normal bowel movement for a few days.  DIET: Your first meal following the procedure should be a light meal and then it is ok to progress to your normal diet.  A half-sandwich or bowl of soup is an example of a good first meal.  Heavy or fried foods are harder to digest and may make you feel nauseous or bloated.  Likewise meals heavy in dairy and vegetables can cause extra gas to form and this can also increase the bloating.  Drink plenty of fluids but you should avoid alcoholic beverages for 24 hours.  ACTIVITY: Your care partner should take you home directly after the procedure.  You should plan to take it easy, moving slowly for the rest of the day.  You can resume normal activity the day after the procedure however you should NOT DRIVE or use heavy machinery for 24 hours (because of the sedation medicines used during the test).    SYMPTOMS TO REPORT IMMEDIATELY: A gastroenterologist can be reached at any hour.  During normal business hours, 8:30 AM to 5:00 PM Monday through Friday,  call [phone].  After hours and on weekends, please call the GI answering service at [phone]  Emergency number handoutwho will take a message and have the physician on call contact you.   Following lower endoscopy (colonoscopy or flexible sigmoidoscopy):  Excessive amounts of blood in the stool  Significant tenderness or worsening of abdominal pains  Swelling of the abdomen that is new, acute  Fever of 100F or higher  FOLLOW UP: If any biopsies were taken you will be contacted by phone or by letter within the next 1-3 weeks.  Call your gastroenterologist if you have not heard about the biopsies in 3 weeks.  Our staff will call the home number listed on your records the next business day following your procedure to check on you and address any questions or concerns that you may have at that time regarding the information given to you following your procedure. This is a courtesy call and so if there is no answer at the home number and we have not heard from you through the emergency physician on call, we will assume that you have returned to your regular daily activities without incident.  SIGNATURES/CONFIDENTIALITY: You and/or your care partner have signed paperwork which will be entered into your electronic medical record.  These signatures attest to the fact that that the information above on your After Visit Summary has been reviewed and is understood.  Full responsibility of the  confidentiality of this discharge information lies with you and/or your care-partner.  Handout on polyps If constipation or hard stools persist, trial of Miralax over the counter 17 grams daily or Linzess 145 mcg daily

## 2013-08-28 NOTE — Op Note (Signed)
[place]  [address] [place], [id number]   COLONOSCOPY PROCEDURE REPORT  PATIENT: Heather, Briggs  MR#: [id number] BIRTHDATE: [birth year] , 42  yrs. old GENDER: Female ENDOSCOPIST: Jerene Bears, MD PROCEDURE DATE:  08/28/2013 PROCEDURE:   Colonoscopy with snare polypectomy First Screening Colonoscopy - Avg.  risk and is 50 yrs.  old or older - No.  Prior Negative Screening - Now for repeat screening. N/A  History of Adenoma - Now for follow-up colonoscopy & has been > or = to 3 yrs.  N/A  Polyps Removed Today? Yes. ASA CLASS:   Class II INDICATIONS:Change in bowel habits, Constipation, and abdominal pain in the lower left quadrant. MEDICATIONS: MAC sedation, administered by CRNA and propofol (Diprivan) 350mg  IV  DESCRIPTION OF PROCEDURE:   After the risks benefits and alternatives of the procedure were thoroughly explained, informed consent was obtained.  A digital rectal exam revealed no rectal mass.   The LB [id number]-[id number]  endoscope was introduced through the anus and advanced to the terminal ileum which was intubated for a short distance. No adverse events experienced.   The quality of the prep was good, using MoviPrep  The instrument was then slowly withdrawn as the colon was fully examined.   COLON FINDINGS: The mucosa appeared normal in the terminal ileum. A sessile polyp measuring 4 mm in size was found in the sigmoid colon.  A polypectomy was performed with a cold snare.  The resection was complete and the polyp tissue was completely retrieved.   The colon mucosa was otherwise normal without inflammation.  Retroflexed views revealed no abnormalities. The time to cecum=4 minutes 19 seconds.  Withdrawal time=10 minutes 54 seconds.  The scope was withdrawn and the procedure completed. COMPLICATIONS: There were no complications.  ENDOSCOPIC IMPRESSION: 1.   Normal mucosa in the terminal ileum 2.   Sessile polyp measuring 4 mm in size was found in the  sigmoid colon; polypectomy was performed with a cold snare 3.   The colon mucosa was otherwise normal  RECOMMENDATIONS: 1.  Await pathology results 2.  If constipation or hard stools persist, trial of MiraLax 17 g daily or Linzess 145 mcg daily 3.  If the polyp removed today is proven to be an adenomatous (pre-cancerous) polyp, you will need a repeat colonoscopy in 5 years.  Otherwise you should continue to follow colorectal cancer screening guidelines for "routine risk" patients with colonoscopy in 10 years.  You will receive a letter within 1-2 weeks with the results of your biopsy as well as final recommendations.  Please call my office if you have not received a letter after 3 weeks.   eSigned:  Jerene Bears, MD 08/28/2013 10:19 AM  cc: The Patient and Florina Ou, MD

## 2013-08-29 ENCOUNTER — Telehealth: Payer: Self-pay

## 2013-08-29 NOTE — Telephone Encounter (Signed)
Left message on answering machine. 

## 2013-09-03 ENCOUNTER — Encounter: Payer: Self-pay | Admitting: Internal Medicine

## 2013-09-09 ENCOUNTER — Encounter: Payer: BC Managed Care – PPO | Admitting: Internal Medicine

## 2014-04-10 ENCOUNTER — Encounter: Payer: Self-pay | Admitting: Nurse Practitioner

## 2014-04-10 ENCOUNTER — Ambulatory Visit (INDEPENDENT_AMBULATORY_CARE_PROVIDER_SITE_OTHER): Payer: BC Managed Care – PPO | Admitting: Nurse Practitioner

## 2014-04-10 VITALS — BP 125/89 | HR 69 | Temp 98.2°F | Resp 18 | Ht 69.75 in | Wt 141.0 lb

## 2014-04-10 DIAGNOSIS — R142 Eructation: Secondary | ICD-10-CM

## 2014-04-10 DIAGNOSIS — R140 Abdominal distension (gaseous): Secondary | ICD-10-CM | POA: Insufficient documentation

## 2014-04-10 DIAGNOSIS — K5900 Constipation, unspecified: Secondary | ICD-10-CM

## 2014-04-10 DIAGNOSIS — R143 Flatulence: Secondary | ICD-10-CM | POA: Insufficient documentation

## 2014-04-10 DIAGNOSIS — R519 Headache, unspecified: Secondary | ICD-10-CM | POA: Insufficient documentation

## 2014-04-10 DIAGNOSIS — R1011 Right upper quadrant pain: Secondary | ICD-10-CM

## 2014-04-10 DIAGNOSIS — R141 Gas pain: Secondary | ICD-10-CM

## 2014-04-10 DIAGNOSIS — R51 Headache: Secondary | ICD-10-CM

## 2014-04-10 LAB — COMPREHENSIVE METABOLIC PANEL
ALBUMIN: 4.4 g/dL (ref 3.5–5.2)
ALT: 13 U/L (ref 0–35)
AST: 30 U/L (ref 0–37)
Alkaline Phosphatase: 31 U/L — ABNORMAL LOW (ref 39–117)
BUN: 11 mg/dL (ref 6–23)
CO2: 28 mEq/L (ref 19–32)
Calcium: 9.5 mg/dL (ref 8.4–10.5)
Chloride: 102 mEq/L (ref 96–112)
Creatinine, Ser: 0.8 mg/dL (ref 0.4–1.2)
GFR: 88.14 mL/min (ref >60.00)
Glucose, Bld: 81 mg/dL (ref 70–99)
Potassium: 5.4 mEq/L — ABNORMAL HIGH (ref 3.5–5.1)
SODIUM: 137 meq/L (ref 135–145)
TOTAL PROTEIN: 7.5 g/dL (ref 6.0–8.3)
Total Bilirubin: 1.4 mg/dL — ABNORMAL HIGH (ref 0.2–1.2)

## 2014-04-10 LAB — CBC WITH DIFFERENTIAL/PLATELET
BASOS PCT: 0.4 % (ref 0.0–3.0)
Basophils Absolute: 0 10*3/uL (ref 0.0–0.1)
Eosinophils Absolute: 0 10*3/uL (ref 0.0–0.7)
Eosinophils Relative: 0.3 % (ref 0.0–5.0)
HCT: 43.4 % (ref 36.0–46.0)
HEMOGLOBIN: 14.6 g/dL (ref 12.0–15.0)
Lymphocytes Relative: 27.9 % (ref 12.0–46.0)
Lymphs Abs: 2.1 10*3/uL (ref 0.7–4.0)
MCHC: 33.7 g/dL (ref 30.0–36.0)
MCV: 90.8 fl (ref 78.0–100.0)
Monocytes Absolute: 0.6 10*3/uL (ref 0.1–1.0)
Monocytes Relative: 8.1 % (ref 3.0–12.0)
NEUTROS PCT: 63.3 % (ref 43.0–77.0)
Neutro Abs: 4.8 10*3/uL (ref 1.4–7.7)
Platelets: 238 10*3/uL (ref 150.0–400.0)
RBC: 4.78 Mil/uL (ref 3.87–5.11)
RDW: 13.1 % (ref 11.5–15.5)
WBC: 7.6 10*3/uL (ref 4.0–10.5)

## 2014-04-10 LAB — LIPASE: LIPASE: 24 U/L (ref 11.0–59.0)

## 2014-04-10 LAB — AMYLASE: Amylase: 63 U/L (ref 27–131)

## 2014-04-10 NOTE — Assessment & Plan Note (Addendum)
Belching, constipation. Start probiotic. Decrease fructose, corn syrup, & artificial sweeteners; refined flours/grains . Increase whole grains, fruit & vegetables. Papain & bromelain from fresh pineapple & papaya.

## 2014-04-10 NOTE — Assessment & Plan Note (Signed)
Constipation described as no BM for 2-3 days, small results-pellet-like Alternates w/diarrhea Does not eat fruit & vegetables daily. Diet changes Start probiotics Enzymes such as bromelain & papain from fresh pineapple & papaya.

## 2014-04-10 NOTE — Assessment & Plan Note (Signed)
Occasional, relieved by rest. Discussed multiple triggers: dehydration, foods, MC HA journal. F/u 2 wks.

## 2014-04-10 NOTE — Assessment & Plan Note (Signed)
GB removed 2011. 5 weeks sharp RUQ pain, worse when hasn't eaten in several hours. ER:[id number] sphincter of Oddi dysfunction, biliary dyskinesia ABD [place], CMET, amylase, lipase, cbc

## 2014-04-10 NOTE — Progress Notes (Signed)
Subjective:     Heather Briggs is a 43 y.o. female who presents for evaluation of abdominal pain. Onset was 5 weeks ago. Symptoms have been unchanged. The pain is described as sharp, and is 4/10 in intensity. Pain is located in the RUQ without radiation.  Aggravating factors: going several hours without eating-worse at early morning hours.  Alleviating factors: none. Associated symptoms: belching and flatus. The patient denies anorexia and fever. She also c/o diarrhea & constipation- longstanding and occasional HA relieved by rest.  The patient's history has been marked as reviewed and updated as appropriate.  Review of Systems Pertinent items are noted in HPI.     Objective:    BP 125/89  Pulse 69  Temp(Src) 98.2 F (36.8 C) (Oral)  Resp 18  Ht 5' 9.75" (1.772 m)  Wt 141 lb (63.957 kg)  BMI 20.37 kg/m2  SpO2 100%  LMP 04/01/2014 General appearance: alert, cooperative, appears stated age and no distress Head: Normocephalic, without obvious abnormality, atraumatic Eyes: negative findings: lids and lashes normal, conjunctivae and sclerae normal and wearing contact lenses Ears: normal TM's and external ear canals both ears Throat: lips, mucosa, and tongue normal; teeth and gums normal Neck: no adenopathy, no carotid bruit, supple, symmetrical, trachea midline and thyroid not enlarged, symmetric, no tenderness/mass/nodules Lungs: clear to auscultation bilaterally Heart: regular rate and rhythm, S1, S2 normal, no murmur, click, rub or gallop Abdomen: normal findings: no masses palpable and no organomegaly and boborygmus, able to press on abdomen & feel gas movement, NT Extremities: extremities normal, atraumatic, no cyanosis or edema    Assessment:  1. RUQ pain - CBC with Differential - Amylase - Lipase - Comprehensive metabolic panel - US Abdomen Complete; Future  2. Unspecified constipation  3. Bloating  4. Flatulence  5. Headache(784.0)  See problem list for complete A&P See  pt instructions. F/u 2 weeks

## 2014-04-10 NOTE — Progress Notes (Signed)
Pre visit review using our clinic review tool, if applicable. No additional management support is needed unless otherwise documented below in the visit note. 

## 2014-04-10 NOTE — Patient Instructions (Addendum)
Get Ultrasound. I will call with results. Make some diet changes: Avoid foods cooked in fats (diarrhea trigger); refined sugars (including sucralose & other artificial sweeteners, corn syrups ) can cause gas-eliminate these. Refined flours & grains will cause gas (cereals, breads, rice with less than 4 grams fiber/serving). Eat cereals & grains with 4 gm fiber or more /serving; plate of green food (dark lettuce, cucumber, avacado, peppers, broccoli, spinach) every day; fresh fruit with every meal (melon, berries, apples, oranges, pears); nuts & seeds for snacks or on salads. Meat 3 or 4 times/week. Avoid dairy-milk & cheese. Almond milk is OK. Headache journal : when you have headache, record 24 recollection of food, beverages, sleep, activity. Bring this with you. Nice to meet you!   Biliary Colic  Biliary colic is a steady or irregular pain in the upper abdomen. It is usually under the right side of the rib cage. It happens when gallstones interfere with the normal flow of bile from the gallbladder. Bile is a liquid that helps to digest fats. Bile is made in the liver and stored in the gallbladder. When you eat a meal, bile passes from the gallbladder through the cystic duct and the common bile duct into the small intestine. There, it mixes with partially digested food. If a gallstone blocks either of these ducts, the normal flow of bile is blocked. The muscle cells in the bile duct contract forcefully to try to move the stone. This causes the pain of biliary colic.  SYMPTOMS   A person with biliary colic usually complains of pain in the upper abdomen. This pain can be:  In the center of the upper abdomen just below the breastbone.  In the upper-right part of the abdomen, near the gallbladder and liver.  Spread back toward the right shoulder blade.  Nausea and vomiting.  The pain usually occurs after eating.  Biliary colic is usually triggered by the digestive system's demand for bile. The  demand for bile is high after fatty meals. Symptoms can also occur when a person who has been fasting suddenly eats a very large meal. Most episodes of biliary colic pass after 1 to 5 hours. After the most intense pain passes, your abdomen may continue to ache mildly for about 24 hours. DIAGNOSIS  After you describe your symptoms, your caregiver will perform a physical exam. He or she will pay attention to the upper right portion of your belly (abdomen). This is the area of your liver and gallbladder. An ultrasound will help your caregiver look for gallstones. Specialized scans of the gallbladder may also be done. Blood tests may be done, especially if you have fever or if your pain persists. PREVENTION  Biliary colic can be prevented by controlling the risk factors for gallstones. Some of these risk factors, such as heredity, increasing age, and pregnancy are a normal part of life. Obesity and a high-fat diet are risk factors you can change through a healthy lifestyle. Women going through menopause who take hormone replacement therapy (estrogen) are also more likely to develop biliary colic. TREATMENT   Pain medication may be prescribed.  You may be encouraged to eat a fat-free diet.  If the first episode of biliary colic is severe, or episodes of colic keep retuning, surgery to remove the gallbladder (cholecystectomy) is usually recommended. This procedure can be done through small incisions using an instrument called a laparoscope. The procedure often requires a brief stay in the hospital. Some people can leave the hospital the same  day. It is the most widely used treatment in people troubled by painful gallstones. It is effective and safe, with no complications in more than 90% of cases.  If surgery cannot be done, medication that dissolves gallstones may be used. This medication is expensive and can take months or years to work. Only small stones will dissolve.  Rarely, medication to dissolve  gallstones is combined with a procedure called shock-wave lithotripsy. This procedure uses carefully aimed shock waves to break up gallstones. In many people treated with this procedure, gallstones form again within a few years. PROGNOSIS  If gallstones block your cystic duct or common bile duct, you are at risk for repeated episodes of biliary colic. There is also a 25% chance that you will develop a gallbladder infection(acute cholecystitis), or some other complication of gallstones within 10 to 20 years. If you have surgery, schedule it at a time that is convenient for you and at a time when you are not sick. HOME CARE INSTRUCTIONS   Drink plenty of clear fluids.  Avoid fatty, greasy or fried foods, or any foods that make your pain worse.  Take medications as directed. SEEK MEDICAL CARE IF:   You develop a fever over 100.5 F (38.1 C).  Your pain gets worse over time.  You develop nausea that prevents you from eating and drinking.  You develop vomiting. SEEK IMMEDIATE MEDICAL CARE IF:   You have continuous or severe belly (abdominal) pain which is not relieved with medications.  You develop nausea and vomiting which is not relieved with medications.  You have symptoms of biliary colic and you suddenly develop a fever and shaking chills. This may signal cholecystitis. Call your caregiver immediately.  You develop a yellow color to your skin or the white part of your eyes (jaundice). Document Released: 12/18/2005 Document Revised: 10/09/2011 Document Reviewed: 02/27/2008 [facility] Patient Information 2015 [place], [place]. This information is not intended to replace advice given to you by your health care provider. Make sure you discuss any questions you have with your health care provider.

## 2014-04-13 ENCOUNTER — Telehealth: Payer: Self-pay | Admitting: Nurse Practitioner

## 2014-04-13 ENCOUNTER — Ambulatory Visit (HOSPITAL_BASED_OUTPATIENT_CLINIC_OR_DEPARTMENT_OTHER)
Admission: RE | Admit: 2014-04-13 | Discharge: 2014-04-13 | Disposition: A | Discharge status: Home / Self Care | Payer: BC Managed Care – PPO | Source: Ambulatory Visit | Attending: Nurse Practitioner | Admitting: Nurse Practitioner

## 2014-04-13 DIAGNOSIS — R1011 Right upper quadrant pain: Secondary | ICD-10-CM | POA: Diagnosis present

## 2014-04-13 DIAGNOSIS — Z9089 Acquired absence of other organs: Secondary | ICD-10-CM | POA: Diagnosis not present

## 2014-04-13 NOTE — Telephone Encounter (Signed)
Left message for pt to call back  °

## 2014-04-13 NOTE — Telephone Encounter (Signed)
abd [place] essentially nml. Bilirubin slightly elevated. Will refer to GI for further w/u of possible biliary dyskinesia vs biliary sphincter of Oddi dysfunction.

## 2014-04-13 NOTE — Telephone Encounter (Signed)
Spoke with pt, advised message from [place]. Pt understood. 

## 2014-04-17 ENCOUNTER — Telehealth: Payer: Self-pay | Admitting: Nurse Practitioner

## 2014-04-17 NOTE — Telephone Encounter (Signed)
Received medical records from the [facility]

## 2014-04-28 ENCOUNTER — Encounter: Payer: Self-pay | Admitting: Nurse Practitioner

## 2014-05-01 ENCOUNTER — Ambulatory Visit: Payer: BC Managed Care – PPO | Admitting: Nurse Practitioner

## 2014-05-08 ENCOUNTER — Ambulatory Visit (INDEPENDENT_AMBULATORY_CARE_PROVIDER_SITE_OTHER): Payer: BC Managed Care – PPO | Admitting: Nurse Practitioner

## 2014-05-08 ENCOUNTER — Encounter: Payer: Self-pay | Admitting: Nurse Practitioner

## 2014-05-08 VITALS — BP 123/78 | HR 81 | Temp 98.5°F | Ht 69.75 in | Wt 139.0 lb

## 2014-05-08 DIAGNOSIS — R51 Headache: Secondary | ICD-10-CM

## 2014-05-08 DIAGNOSIS — R519 Headache, unspecified: Secondary | ICD-10-CM

## 2014-05-08 MED ORDER — FLUTICASONE PROPIONATE 50 MCG/ACT NA SUSP: 1.0000 | Freq: Two times a day (BID) | NASAL | Status: DC

## 2014-05-08 NOTE — Progress Notes (Signed)
Pre visit review using our clinic review tool, if applicable. No additional management support is needed unless otherwise documented below in the visit note. 

## 2014-05-08 NOTE — Patient Instructions (Signed)
Sounds like HA are related to allergies, possibly to TMJ.  Stop chewing gum. Take note if you have more headaches when you are eating foods that cause more chewing like salad-tough breads, etc.  Start sinus rinse daily to decrease allergenic load. Start flonase daily (after sinus rinse)-1 spray each nare twice daily. Lean forward when spraying, NOT backwards.  Continue with probiotics & fruit & vegetables daily.  Return in 2-3 weeks to evaluate headache response.  Temporomandibular Problems  Temporomandibular joint (TMJ) dysfunction means there are problems with the joint between your jaw and your skull. This is a joint lined by cartilage like other joints in your body but also has a small disc in the joint which keeps the bones from rubbing on each other. These joints are like other joints and can get inflamed (sore) from arthritis and other problems. When this joint gets sore, it can cause headaches and pain in the jaw and the face. CAUSES  Usually the arthritic types of problems are caused by soreness in the joint. Soreness in the joint can also be caused by overuse. This may come from grinding your teeth. It may also come from mis-alignment in the joint. DIAGNOSIS Diagnosis of this condition can often be made by history and exam. Sometimes your caregiver may need X-rays or an MRI scan to determine the exact cause. It may be necessary to see your dentist to determine if your teeth and jaws are lined up correctly. TREATMENT  Most of the time this problem is not serious; however, sometimes it can persist (become chronic). When this happens medications that will cut down on inflammation (soreness) help. Sometimes a shot of cortisone into the joint will be helpful. If your teeth are not aligned it may help for your dentist to make a splint for your mouth that can help this problem. If no physical problems can be found, the problem may come from tension. If tension is found to be the cause,  biofeedback or relaxation techniques may be helpful. HOME CARE INSTRUCTIONS   Later in the day, applications of ice packs may be helpful. Ice can be used in a plastic bag with a towel around it to prevent frostbite to skin. This may be used about every 2 hours for 20 to 30 minutes, as needed while awake, or as directed by your caregiver.  Only take over-the-counter or prescription medicines for pain, discomfort, or fever as directed by your caregiver.  If physical therapy was prescribed, follow your caregiver's directions.  Wear mouth appliances as directed if they were given. Document Released: 04/11/2001 Document Revised: 10/09/2011 Document Reviewed: 07/19/2008 [facility] Patient Information 2015 [place], [place]. This information is not intended to replace advice given to you by your health care provider. Make sure you discuss any questions you have with your health care provider.

## 2014-05-13 DIAGNOSIS — R51 Headache: Principal | ICD-10-CM

## 2014-05-13 DIAGNOSIS — R519 Headache, unspecified: Secondary | ICD-10-CM | POA: Insufficient documentation

## 2014-05-13 NOTE — Progress Notes (Signed)
Subjective:     Heather Briggs is a 43 y.o. female returns for f/u of flatulence & RUQ pain and headaches. She was seen in office about 3 weeks ago. I recommended diet changes & probiotics. We also did Abd US-nml. Pt states pain has resolved & constipation also resolved. She continues to have daily HA that seem to be triggered w/eating. She says her head has dull pain at all times-never goes away. 2 tabs ibuprophen do not help. Pain seems to be worse within 5 minutes of eating. I wonder if pain is mechanical-TMJ. She says she chews gum "a lot".  She thinks she has food allergies that trigger HA. She started (5 days) a gluten free diet with no improvement yet. I discussed other possible triggers such as tyramine found in meat-highest in aged meat. She reports facial flushing when drinks red wine-perhaps she has a sulfite allergy & should avoid aged foods. She also mentions that she clears throat often-perhaps she has chronic environmental allergies that contribute to HA. She reports using flonase many years ago w/relief of allergy symptoms.  The following portions of the patient's history were reviewed and updated as appropriate: allergies, current medications, past medical history, past social history, past surgical history and problem list.  Review of Systems Pertinent items are noted in HPI.    Objective:    BP 123/78  Pulse 81  Temp(Src) 98.5 F (36.9 C) (Temporal)  Ht 5' 9.75" (1.772 m)  Wt 139 lb (63.05 kg)  BMI 20.08 kg/m2  SpO2 100%  LMP 04/28/2014 BP 123/78  Pulse 81  Temp(Src) 98.5 F (36.9 C) (Temporal)  Ht 5' 9.75" (1.772 m)  Wt 139 lb (63.05 kg)  BMI 20.08 kg/m2  SpO2 100%  LMP 04/28/2014 General appearance: alert, cooperative, appears stated age and no distress Head: Normocephalic, without obvious abnormality, atraumatic Eyes: negative findings: lids and lashes normal, conjunctivae and sclerae normal, corneas clear and pupils equal, round, reactive to light and  accomodation Ears: normal TM's and external ear canals both ears Throat: lips, mucosa, and tongue normal; teeth and gums normal Lymph nodes: no cervical or [place] LAD Neurologic: Grossly normal  No popping or crepitus plapated w/movement of jaw.   Assessment:   1. Headache around the eyes DD: TMJ, allergies - fluticasone (FLONASE) 50 MCG/ACT nasal spray; Place 1 spray into both nostrils 2 (two) times daily.  Dispense: 16 g; Refill: 6 Daily sinus rinses. Elimination diet. Stop chewing gum. See pt instructions. F/u 2-3 weeks.

## 2014-06-01 ENCOUNTER — Encounter: Payer: Self-pay | Admitting: Nurse Practitioner

## 2014-06-22 ENCOUNTER — Other Ambulatory Visit (INDEPENDENT_AMBULATORY_CARE_PROVIDER_SITE_OTHER): Payer: BC Managed Care – PPO

## 2014-06-22 ENCOUNTER — Encounter: Payer: Self-pay | Admitting: Internal Medicine

## 2014-06-22 ENCOUNTER — Ambulatory Visit (INDEPENDENT_AMBULATORY_CARE_PROVIDER_SITE_OTHER): Payer: BC Managed Care – PPO | Admitting: Internal Medicine

## 2014-06-22 VITALS — BP 112/78 | HR 84 | Ht 69.25 in | Wt 140.0 lb

## 2014-06-22 DIAGNOSIS — K5900 Constipation, unspecified: Secondary | ICD-10-CM

## 2014-06-22 DIAGNOSIS — R17 Unspecified jaundice: Secondary | ICD-10-CM

## 2014-06-22 DIAGNOSIS — K589 Irritable bowel syndrome without diarrhea: Secondary | ICD-10-CM

## 2014-06-22 DIAGNOSIS — R1011 Right upper quadrant pain: Secondary | ICD-10-CM

## 2014-06-22 LAB — HEPATIC FUNCTION PANEL
ALK PHOS: 31 U/L — AB (ref 39–117)
ALT: 14 U/L (ref 0–35)
AST: 18 U/L (ref 0–37)
Albumin: 4.5 g/dL (ref 3.5–5.2)
BILIRUBIN TOTAL: 0.7 mg/dL (ref 0.2–1.2)
Bilirubin, Direct: 0.1 mg/dL (ref 0.0–0.3)
Total Protein: 7.3 g/dL (ref 6.0–8.3)

## 2014-06-22 MED ORDER — LUBIPROSTONE 8 MCG PO CAPS: 8.0000 ug | ORAL_CAPSULE | Freq: Two times a day (BID) | ORAL | Status: DC

## 2014-06-22 MED ORDER — LUBIPROSTONE 8 MCG PO CAPS: 8.0000 ug | ORAL_CAPSULE | Freq: Every day | ORAL | Status: DC

## 2014-06-22 NOTE — Progress Notes (Signed)
Subjective:    Patient ID: Heather Briggs, female    DOB: [birth year], 43 y.o.   MRN: [id number]  HPI [place] is a 43 year old female seen previously to evaluate change in bowel habits and left lower quadrant abdominal pain who is here for follow-up. She had a colonoscopy performed on 08/28/2013 which revealed normal terminal ileum, a 4 mm sessile sigmoid tubular adenoma removed with cold snare and otherwise normal colonoscopy. She tried Linzess 145 g daily but had diarrhea. She still has issues with constipation but she has been trying to deal with this through diet modification. She is currently eating a gluten free diet.  Bowel movements have continued to be erratic. Over the summer she was using probiotic with improvement in bloating symptomatology and constipation but the effect is seem to remit. She's also been having headaches which come and go. No change in headaches with gluten-free diet. Several months ago she was having episodic right upper quadrant pain and was found to have a slight elevation in bilirubin. Ultrasound was done which was unremarkable. She has previous had a cholecystectomy. No itching or jaundice. No blood in her stool or melena. Diet overall she reports is healthy but can be erratic. More recently the right upper quadrant pain has resolved and she has had some mild right scapular pain over the last several days. She denies heartburn, nausea or vomiting. Appetite is good  Review of Systems As per HPI, otherwise negative  Current Medications, Allergies, Past Medical History, Past Surgical History, Family History and Social History were reviewed in [employer] record.     Objective:   Physical Exam BP 112/78 mmHg  Pulse 84  Ht 5' 9.25" (1.759 m)  Wt 140 lb (63.504 kg)  BMI 20.52 kg/m2  LMP 06/13/2014 Constitutional: Well-developed and well-nourished. No distress. HEENT: Normocephalic and atraumatic. Oropharynx is clear and moist. No oropharyngeal  exudate. Conjunctivae are normal.  No scleral icterus. Neck: Neck supple. Trachea midline. Cardiovascular: Normal rate, regular rhythm and intact distal pulses. No M/R/G Pulmonary/chest: Effort normal and breath sounds normal. No wheezing, rales or rhonchi. Abdominal: Soft, nontender, nondistended. Bowel sounds active throughout. There are no masses palpable. No hepatosplenomegaly. Extremities: no clubbing, cyanosis, or edema Lymphadenopathy: No cervical adenopathy noted. Neurological: Alert and oriented to person place and time. Skin: Skin is warm and dry. No rashes noted. Psychiatric: Normal mood and affect. Behavior is normal.  Celiac panel neg Lab Results  Component Value Date   TSH 0.89 07/04/2013   CBC    Component Value Date/Time   WBC 7.6 04/10/2014 1142   RBC 4.78 04/10/2014 1142   HGB 14.6 04/10/2014 1142   HCT 43.4 04/10/2014 1142   PLT 238.0 04/10/2014 1142   MCV 90.8 04/10/2014 1142   MCHC 33.7 04/10/2014 1142   RDW 13.1 04/10/2014 1142   LYMPHSABS 2.1 04/10/2014 1142   MONOABS 0.6 04/10/2014 1142   EOSABS 0.0 04/10/2014 1142   BASOSABS 0.0 04/10/2014 1142    CMP     Component Value Date/Time   NA 137 04/10/2014 1142   K 5.4 Hemolyzed* 04/10/2014 1142   CL 102 04/10/2014 1142   CO2 28 04/10/2014 1142   GLUCOSE 81 04/10/2014 1142   BUN 11 04/10/2014 1142   CREATININE 0.8 04/10/2014 1142   CALCIUM 9.5 04/10/2014 1142   PROT 7.5 04/10/2014 1142   ALBUMIN 4.4 04/10/2014 1142   AST 30 04/10/2014 1142   ALT 13 04/10/2014 1142   ALKPHOS 31* 04/10/2014 1142  BILITOT 1.4* 04/10/2014 1142   GFRNONAA >60 01/07/2008 1826   GFRAA  01/07/2008 1826    >60        The eGFR has been calculated using the MDRD equation. This calculation has not been validated in all clinical   ULTRASOUND ABDOMEN COMPLETE   COMPARISON:  11/30/2009   FINDINGS: Gallbladder:   Surgically absent   Common bile duct:   Diameter: 2 mm   Liver:   No focal lesion identified.  Within normal limits in parenchymal echogenicity.   IVC:   No abnormality visualized.   Pancreas:   Visualized portion unremarkable.   Spleen:   Size and appearance within normal limits.   Right Kidney:   Length: 10.1 cm. Echogenicity within normal limits. No mass or hydronephrosis visualized.   Left Kidney:   Length: 10.1 cm. Echogenicity within normal limits. No mass or hydronephrosis visualized.   Abdominal aorta:   No aneurysm visualized.   Other findings:   None.   IMPRESSION: 1. No acute findings. 2. Status post cholecystectomy. 3. No other abnormalities.     Electronically Signed   By: Lajean Manes M.D.   On: 04/13/2014 11:33       Assessment & Plan:  43 year old female seen previously to evaluate change in bowel habits and left lower quadrant abdominal pain who is here for follow-up.  1. Constipation/abd bloating -- still with constipation and she had diarrhea with Linzess. She would like to go the natural route but has been unsuccessful so far with dietary modification alone. We will have her try lubiprostone 8 g once daily. We did discuss diarrhea as a main side effect. She will continue daily probiotic. Colonoscopy reviewed and she is aware of my recommendation for 5 year recall for colonoscopy.  Trial of FODMAP diet.  Florastor BID.  I do think she has an element of IBS   2. RUQ pain/elevated bili -- she is status post cholecystectomy. Bile duct was normal caliber in September on ultrasound. Will repeat hepatic function panel today and determine if bilirubin is conjugated or unconjugated, and if indeed still elevated. If it remains elevated would consider MRCP. Sphincter of oddi dysfunction was discussed, but this is overall rare and I'm not convinced this is what's going on. If we wanted to work this up further I would recommend tertiary GI clinic for biliary manometry and further discussion of SOD.  Return in 8 weeks, sooner if needed

## 2014-06-22 NOTE — Patient Instructions (Addendum)
Your physician has requested that you go to the basement for the following lab work before leaving today:  Hepatic function panel  We have sent the following medications to your pharmacy for you to pick up at your convenience:  Amitiza  You have been given a copy of the FODMAP diet.    Please follow up with Dr. Hilarie Fredrickson on 08/26/2014 at 10:00am

## 2014-08-19 ENCOUNTER — Encounter: Payer: Self-pay | Admitting: *Deleted

## 2014-08-26 ENCOUNTER — Ambulatory Visit: Payer: BC Managed Care – PPO | Admitting: Internal Medicine

## 2014-09-09 ENCOUNTER — Ambulatory Visit (INDEPENDENT_AMBULATORY_CARE_PROVIDER_SITE_OTHER): Payer: BLUE CROSS/BLUE SHIELD | Admitting: Internal Medicine

## 2014-09-09 ENCOUNTER — Encounter: Payer: Self-pay | Admitting: Internal Medicine

## 2014-09-09 VITALS — BP 130/78 | HR 77 | Ht 69.5 in | Wt 143.8 lb

## 2014-09-09 DIAGNOSIS — Z86010 Personal history of colonic polyps: Secondary | ICD-10-CM

## 2014-09-09 DIAGNOSIS — R17 Unspecified jaundice: Secondary | ICD-10-CM

## 2014-09-09 DIAGNOSIS — K5900 Constipation, unspecified: Secondary | ICD-10-CM

## 2014-09-09 DIAGNOSIS — K589 Irritable bowel syndrome without diarrhea: Secondary | ICD-10-CM

## 2014-09-09 MED ORDER — BENEFIBER PO POWD: ORAL | Status: DC

## 2014-09-09 NOTE — Patient Instructions (Signed)
Please discontinue your Amitiza  Begin taking Benefiber - 1 tablespoon once a day mixed with water or juice.  You may increase to twice a day if necessary.  Please follow up with Dr. Hilarie Fredrickson as needed

## 2014-09-09 NOTE — Progress Notes (Signed)
Subjective:    Patient ID: Heather Briggs, female    DOB: 21-May-1971, 44 y.o.   MRN: [id number]  HPI [place] is a 43 yo female with PMH of IBS with constipation, history of RUQ pain who is seen in follow-up.  He was last seen on 06/22/2014. At that time she previously tried Linzess for constipation but had diarrhea. Lubiprostone 8 g twice daily was recommended. She tried this but did not have a favorable response. She did have some diarrhea but also had aching abdominal pain so she discontinued it. She is still having issues with constipation but this varies somewhat with her diet. No rectal bleeding or melena. No abdominal pain. The previous right upper quadrant abdominal pain she was experiencing has resolved completely. Appetite has been good with no nausea or vomiting. No weight loss. No fevers or chills. No itching or jaundice. She is still using daily probiotic. She also tried the FODMAP diet but found this difficult to adhere to. She still develops abdominal bloating on occasion secondary to her diet. She will occasionally have loose stools a few days prior to her menstrual cycle which have been regular.  Review of Systems As per history of present illness, otherwise negative  Current Medications, Allergies, Past Medical History, Past Surgical History, Family History and Social History were reviewed in [employer] record.     Objective:   Physical Exam BP 130/78 mmHg  Pulse 77  Ht 5' 9.5" (1.765 m)  Wt 143 lb 12.8 oz (65.227 kg)  BMI 20.94 kg/m2  SpO2 96% Constitutional: Well-developed and well-nourished. No distress.  Conjunctivae are normal.  No scleral icterus. Neck: Neck supple. Trachea midline. Cardiovascular: Normal rate, regular rhythm and intact distal pulses. No M/R/G Pulmonary/chest: Effort normal and breath sounds normal. No wheezing, rales or rhonchi. Abdominal: Soft, nontender, nondistended. Bowel sounds active throughout. There are no masses  palpable. No hepatosplenomegaly. Extremities: no clubbing, cyanosis, or edema Neurological: Alert and oriented to person place and time. Skin: Skin is warm and dry. No rashes noted. Psychiatric: Normal mood and affect. Behavior is normal.  Hepatic Function Latest Ref Rng 06/22/2014 04/10/2014 07/04/2013  Total Protein 6.0 - 8.3 g/dL 7.3 7.5 7.4  Albumin 3.5 - 5.2 g/dL 4.5 4.4 4.4  AST 0 - 37 U/L $Remo'18 30 17  'HUhyc$ ALT 0 - 35 U/L $Remo'14 13 14  'EysLo$ Alk Phosphatase 39 - 117 U/L 31(L) 31(L) 28(L)  Total Bilirubin 0.2 - 1.2 mg/dL 0.7 1.4(H) 1.2  Bilirubin, Direct 0.0 - 0.3 mg/dL 0.1 - -   Previous abd [place] and colonoscopy reviewed including with the pt    Assessment & Plan:  44 yo female with PMH of IBS with constipation, history of RUQ pain who is seen in follow-up.   1./ IBS-C -- her biggest issue continues to be intermittent constipation and abdominal bloating felt secondary to IBS. Celiac negative with no evidence of IBD. She has not responded favorably to pharmacologic laxatives and she would like to avoid medications if possible. I recommended Benefiber 1 tablespoon once to twice daily. We discussed how she will need to take this on a regular basis to see benefit. I've also encouraged her to try to drink at least 60 ounces of fluid daily. She can try Florastor twice daily as a different probiotic to see if this helps. Avoid trigger foods as she is doing. She is happy with this plan.  2. Right upper quadrant pain -- resolved entirely. Bilirubin had been elevated but this  is also normal when checked after last visit. No elevation in serum transaminases either. She is asked to notify me if upper quadrant pain recurs. She voices understanding  3. History of tubular adenoma -- small adenoma removed at colonoscopy in January 2015, repeat recommended January 2020

## 2014-09-22 ENCOUNTER — Other Ambulatory Visit: Payer: Self-pay | Admitting: Obstetrics and Gynecology

## 2014-09-23 LAB — CYTOLOGY - PAP

## 2015-06-06 IMAGING — US US ABDOMEN COMPLETE
1 series · 14 of 25 positions shown · non-contrast
Comparison: 11/30/2009

CLINICAL DATA: Right upper quadrant pain radiating to the back.
History of a cholecystectomy.

EXAM:
ULTRASOUND ABDOMEN COMPLETE

[Series 1: us abdomen complete · 0.20mm/px · 14 of 50 slices shown]
[im 1/50]
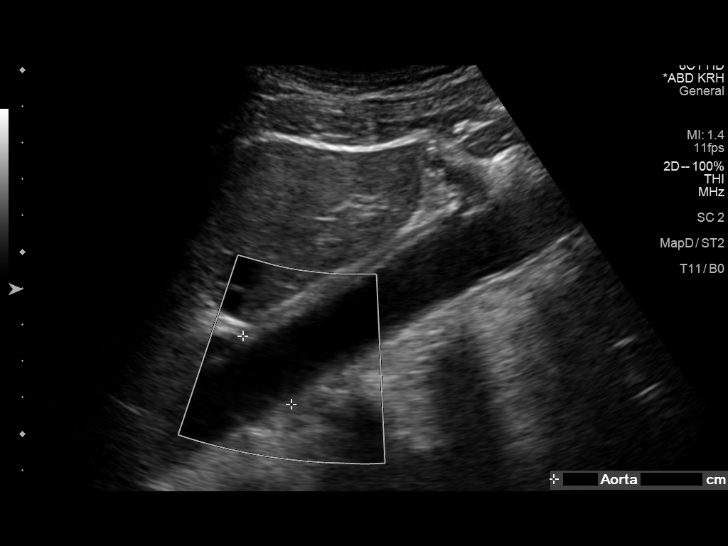
[im 5/50]
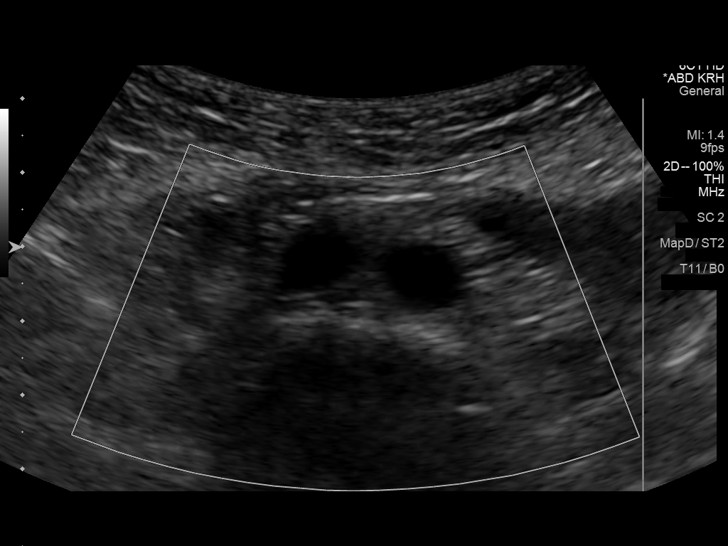
[im 9/50]
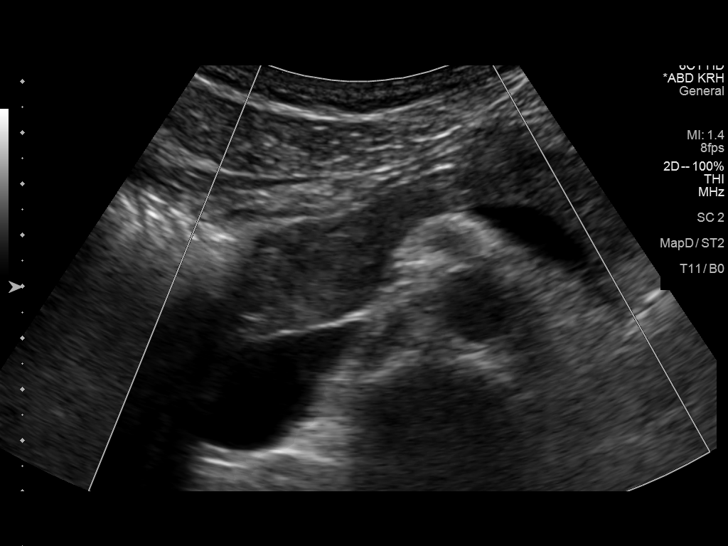
[im 13/50]
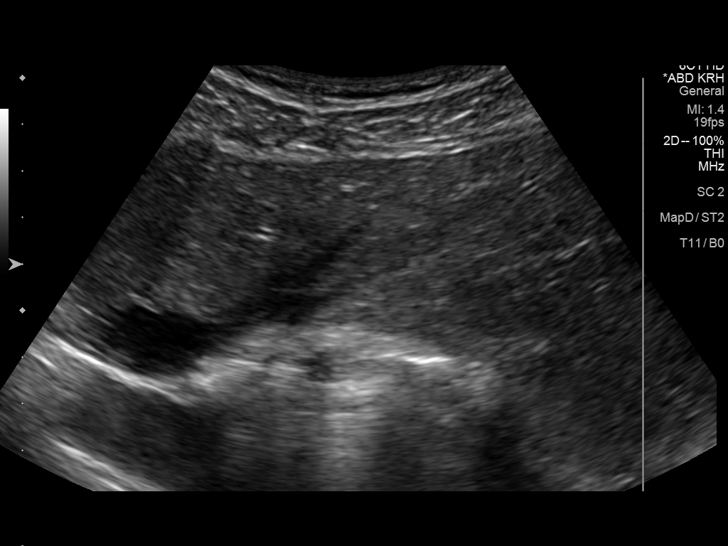
[im 17/50]
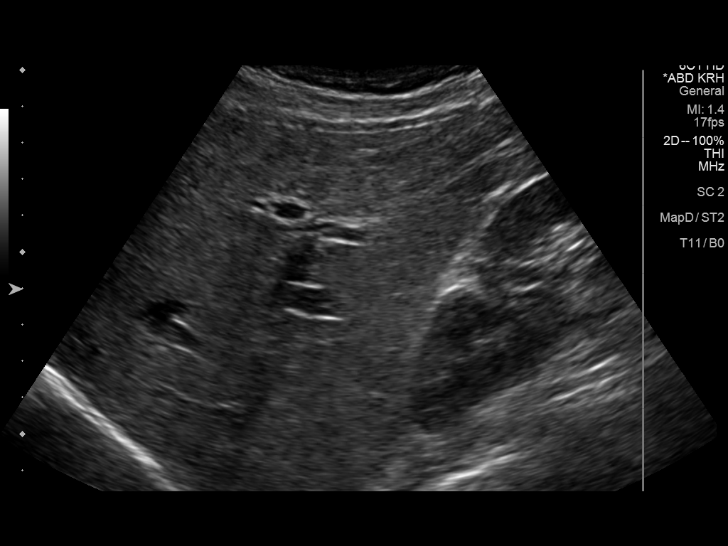
[im 19/50]
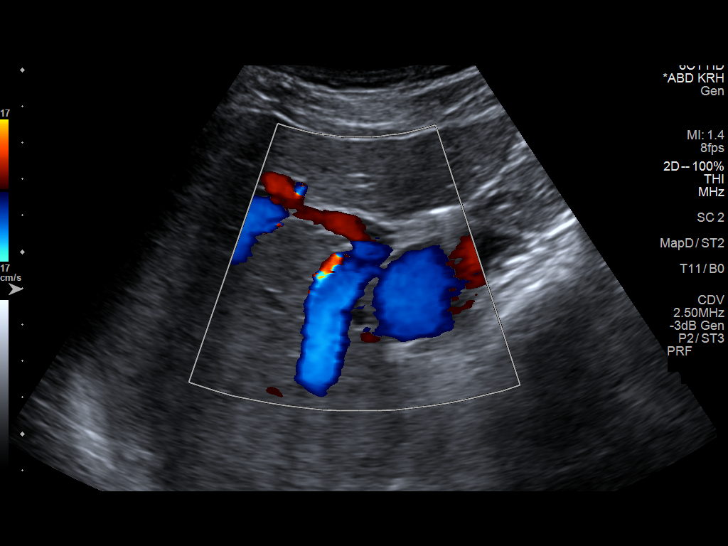
[im 23/50]
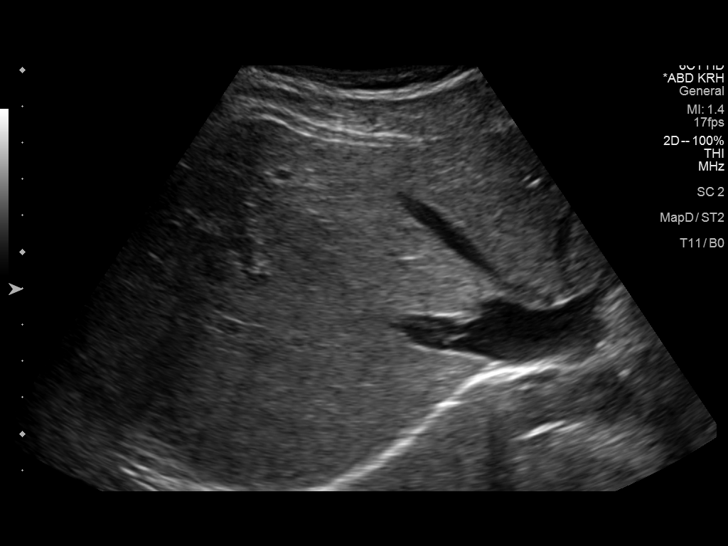
[im 27/50]
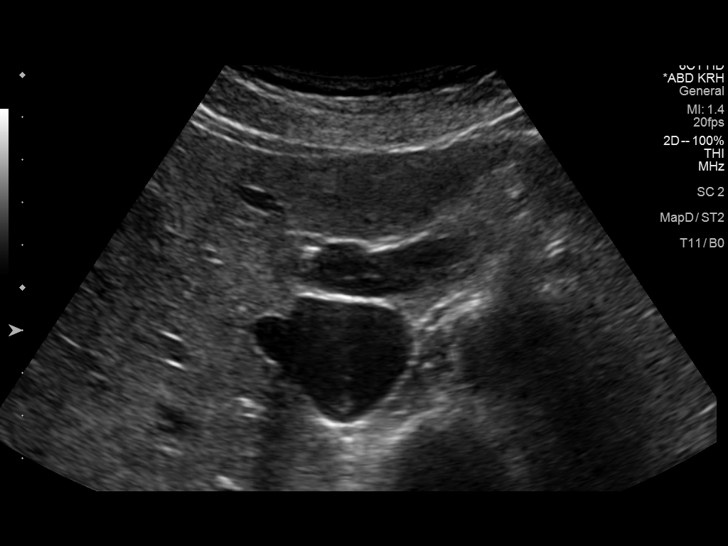
[im 31/50]
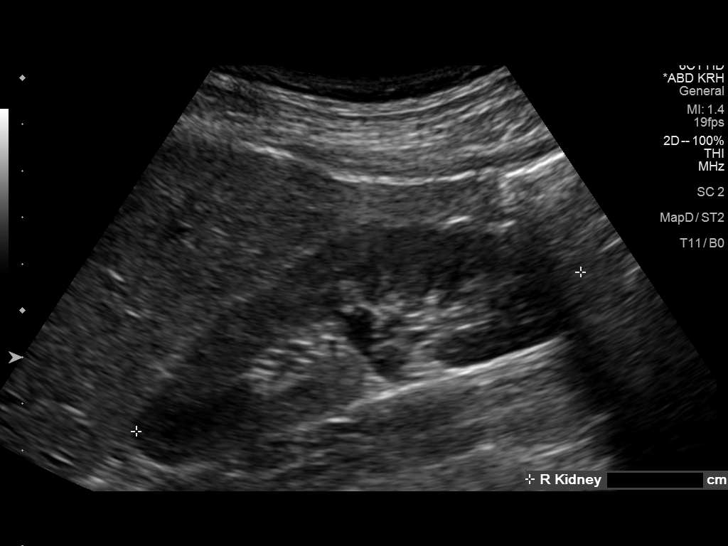
[im 33/50]
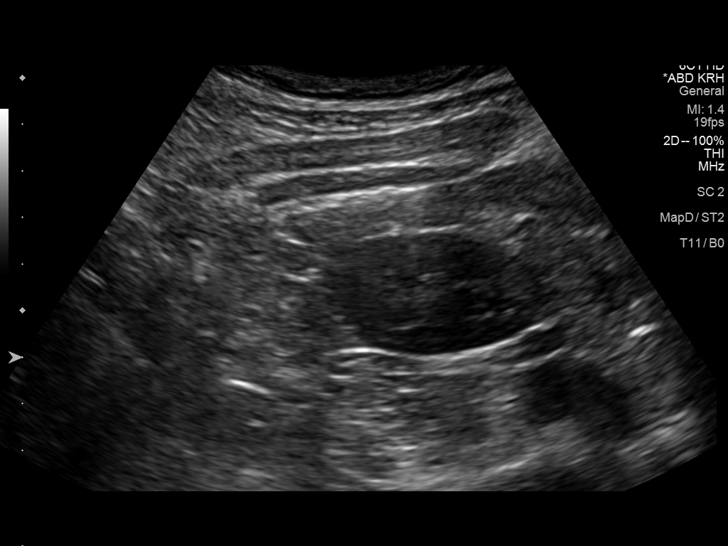
[im 37/50]
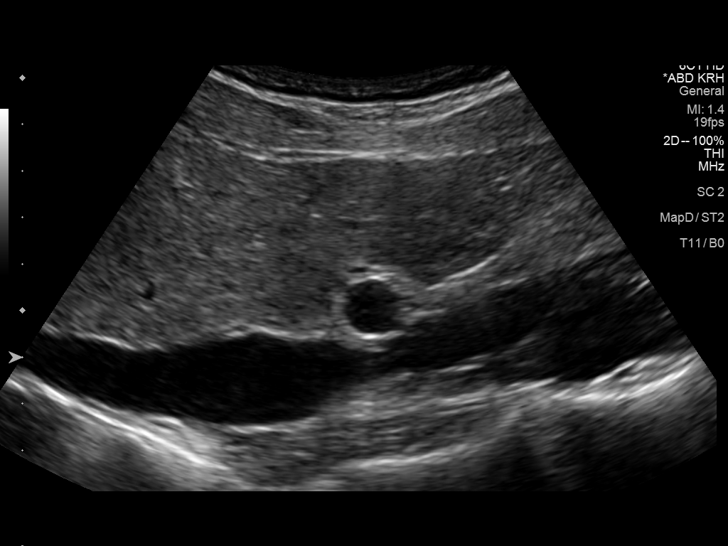
[im 41/50]
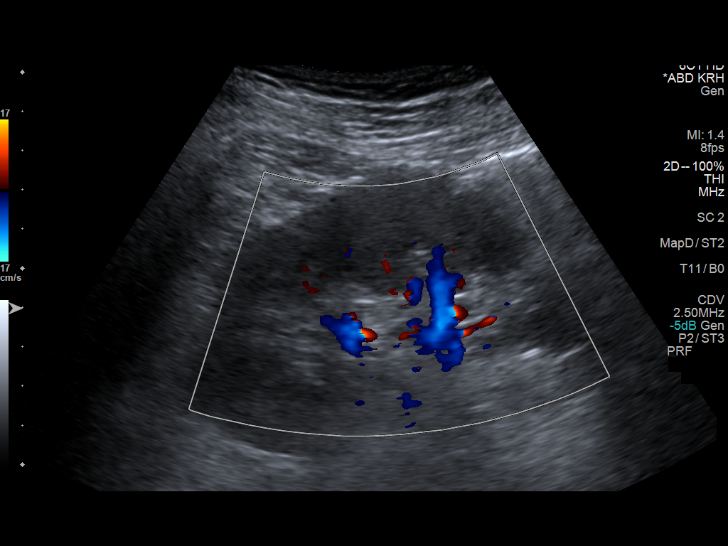
[im 45/50]
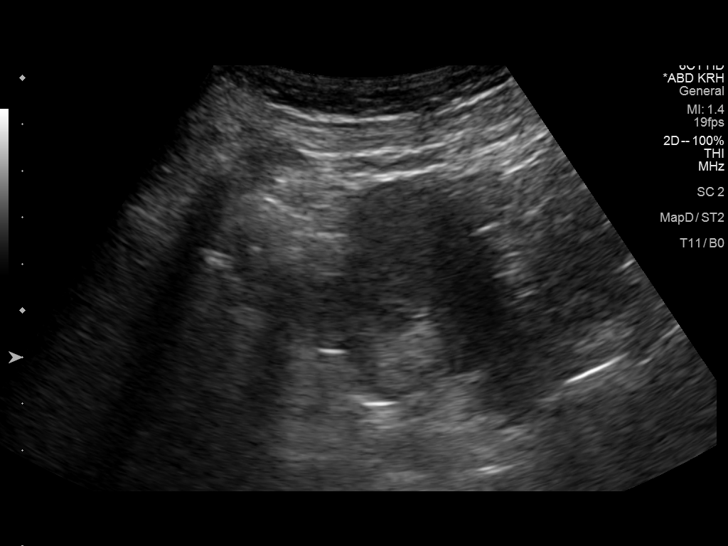
[im 50/50]
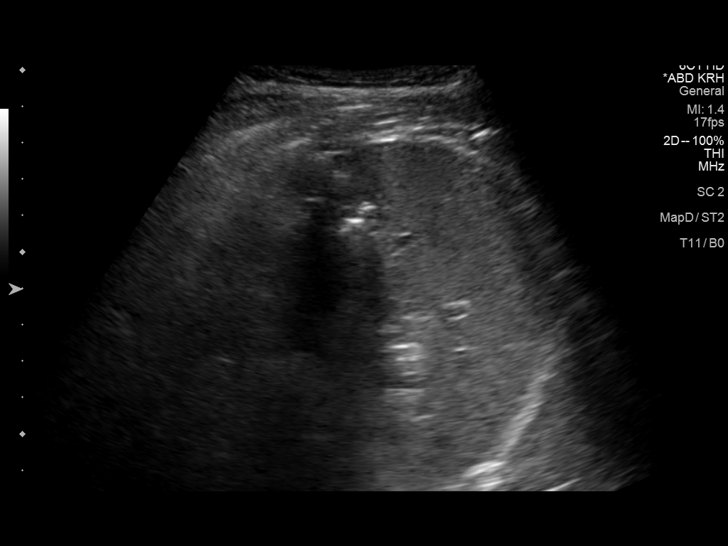

[14 of 25 positions shown; findings below may reference images not displayed]

FINDINGS: Gallbladder:

Surgically absent

Common bile duct:

Diameter: 2 mm

Liver:

No focal lesion identified. Within normal limits in parenchymal
echogenicity.

IVC:

No abnormality visualized.

Pancreas:

Visualized portion unremarkable.

Spleen:

Size and appearance within normal limits.

Right Kidney:

Length: 10.1 cm. Echogenicity within normal limits. No mass or
hydronephrosis visualized.

Left Kidney:

Length: 10.1 cm. Echogenicity within normal limits. No mass or
hydronephrosis visualized.

Abdominal aorta:

No aneurysm visualized.

Other findings:

None.
IMPRESSION: 1. No acute findings.
2. Status post cholecystectomy.
3. No other abnormalities.

## 2015-08-31 ENCOUNTER — Encounter: Payer: Self-pay | Admitting: Family Medicine

## 2015-08-31 ENCOUNTER — Ambulatory Visit (INDEPENDENT_AMBULATORY_CARE_PROVIDER_SITE_OTHER): Payer: BLUE CROSS/BLUE SHIELD | Admitting: Family Medicine

## 2015-08-31 VITALS — BP 126/78 | HR 78 | Temp 97.7°F | Resp 20 | Ht 70.0 in | Wt 147.8 lb

## 2015-08-31 DIAGNOSIS — Z0000 Encounter for general adult medical examination without abnormal findings: Secondary | ICD-10-CM | POA: Diagnosis not present

## 2015-08-31 DIAGNOSIS — E559 Vitamin D deficiency, unspecified: Secondary | ICD-10-CM

## 2015-08-31 DIAGNOSIS — R252 Cramp and spasm: Secondary | ICD-10-CM

## 2015-08-31 LAB — COMPREHENSIVE METABOLIC PANEL
ALBUMIN: 4 g/dL (ref 3.6–5.1)
ALT: 11 U/L (ref 6–29)
AST: 17 U/L (ref 10–30)
Alkaline Phosphatase: 32 U/L — ABNORMAL LOW (ref 33–115)
BUN: 16 mg/dL (ref 7–25)
CALCIUM: 9.5 mg/dL (ref 8.6–10.2)
CHLORIDE: 101 mmol/L (ref 98–110)
CO2: 29 mmol/L (ref 20–31)
Creat: 0.74 mg/dL (ref 0.50–1.10)
Glucose, Bld: 88 mg/dL (ref 65–99)
POTASSIUM: 4.6 mmol/L (ref 3.5–5.3)
Sodium: 137 mmol/L (ref 135–146)
Total Bilirubin: 0.7 mg/dL (ref 0.2–1.2)
Total Protein: 7.1 g/dL (ref 6.1–8.1)

## 2015-08-31 LAB — CBC
HCT: 44 % (ref 36.0–46.0)
Hemoglobin: 14.5 g/dL (ref 12.0–15.0)
MCH: 29.8 pg (ref 26.0–34.0)
MCHC: 33 g/dL (ref 30.0–36.0)
MCV: 90.5 fL (ref 78.0–100.0)
MPV: 11.3 fL (ref 8.6–12.4)
Platelets: 275 10*3/uL (ref 150–400)
RBC: 4.86 MIL/uL (ref 3.87–5.11)
RDW: 13 % (ref 11.5–15.5)
WBC: 8.3 10*3/uL (ref 4.0–10.5)

## 2015-08-31 LAB — MAGNESIUM: Magnesium: 2 mg/dL (ref 1.5–2.5)

## 2015-08-31 LAB — FERRITIN: Ferritin: 28 ng/mL (ref 10–291)

## 2015-08-31 LAB — VITAMIN B12: VITAMIN B 12: 260 pg/mL (ref 211–911)

## 2015-08-31 LAB — VITAMIN D 25 HYDROXY (VIT D DEFICIENCY, FRACTURES): VITD: 19.19 ng/mL — ABNORMAL LOW (ref 30.00–100.00)

## 2015-08-31 LAB — TSH: TSH: 0.535 u[IU]/mL (ref 0.350–4.500)

## 2015-08-31 NOTE — Progress Notes (Signed)
Subjective:    Patient ID: Heather Briggs, female    DOB: [birth year], 45 y.o.   MRN: [id number]  HPI  Vit D deficiency/leg cramps: Patient reports a history of low vitamin D, with supplementation last year. She currently does not take any over-the-counter supplementation. Patient states she now has increasing leg pain that is only below the knee to her feet. She is uncertain if this is related to her low vitamin D. She states that she does take a daily magnesium supplement. She reports she is doing more exercises in her yoga class. She describes the pain as sometimes a deep ache versus cramping. Her Not Tender to the Touch. Sometimes stretching and heat application will help take the edge off the discomfort. Patient states she drinks plenty of water daily.   Health Maintenance: Declines flu shot today. Health maintenance:  Colonoscopy: Personal history of colon polyps, follows with Dr. Hilarie Fredrickson, last colonoscopy 2015 with a five-year follow-up recommended. Mammogram: Patient has gynecologist, Dr. [facility], mammogram showed some mild calcifications today, patient was encouraged to follow-up in 6 months with repeat exam. She receives her studies through [facility]. Cervical cancer screening: Follows with gynecology, last Pap smear 2016. Immunizations: Tetanus vaccination up-to-date 2011, declines flu shot. Infectious disease screening: HIV up-to-date   Past Medical History  Diagnosis Date  . Heart palpitations     typically occur at bedtime  . Anxiety   . Hemorrhoids   . Tubular adenoma of colon    Allergies  Allergen Reactions  . Ciprofloxacin Other (See Comments)    Adverse reactions  . Prochlorperazine Edisylate     Unsure of reaction  . Penicillins Rash  . Sulfonamide Derivatives Rash   Past Surgical History  Procedure Laterality Date  . Cholecystectomy    . Cesarean section  2004  . Hemorroidectomy    . Hemorrhoid surgery    . Breast biopsy Right    Family  History  Problem Relation Age of Onset  . Hypertension Mother   . Hyperlipidemia Mother   . Colon polyps Mother     benign  . Hypertension Father   . Diabetes Father 32  . Heart disease Father   . Colon cancer Neg Hx   . Esophageal cancer Neg Hx   . Stomach cancer Neg Hx   . Rectal cancer Neg Hx    Social History   Social History  . Marital Status: Married    Spouse Name: N/A  . Number of Children: 2  . Years of Education: N/A   Occupational History  . Housewife    Social History Main Topics  . Smoking status: Former Smoker    Types: Cigarettes  . Smokeless tobacco: Never Used  . Alcohol Use: Yes     Comment: social  . Drug Use: No  . Sexual Activity: Yes    Birth Control/ Protection: None   Other Topics Concern  . Not on file   Social History Narrative   Ms. Fazzino lives with her husband & 2 children. 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     Review of Systems Negative, with the exception of above mentioned in HPI     Objective:   Physical Exam BP 126/78 mmHg  Pulse 78  Temp(Src) 97.7 F (36.5 C)  Resp 20  Ht _0  (1.778 m)  Wt 147 lb 12 oz (67.019 kg)  BMI 21.20 kg/m2  SpO2 100%  LMP 08/22/2015 Gen: Afebrile. No acute distress. Nontoxic in appearance, well-developed, well-nourished, Caucasian female.  Pleasant HENT: AT. .MMM. Eyes:Pupils Equal Round Reactive to light, Extraocular movements intact,  Conjunctiva without redness, discharge or icterus. Neck/lymp/endocrine: Supple, no lymphadenopathy, no thyromegaly CV: RRR m/c/g/r, no edema, +2/4 P posterior tibialis pulses Chest: CTAB, no wheeze or crackles MSK: No erythema, no soft tissue swelling, no edema, no skin changes, no bruising. No tenderness to palpation bilateral calves. Negative Homans, neurovascularly intact distally. Skin: No rashes, purpura or petechiae.  Neuro: Normal gait. PERLA. EOMi. Alert. Oriented. Cranial nerves II through XII intact. Muscle strength 5/5 upper and lower extremity. Her  extremity deep tendon reflexes equal bilaterally, mildly hyperreflexive. Psych: Normal affect, dress and demeanor. Normal speech. Normal thought content and judgment    Assessment & Plan:  Heather Briggs is a 45 y.o. female present for leg discomfort and follow up on vitamin D deficiency.  1. Cramp of both lower extremities - Discussed with patient the cause of leg cramps can sometimes never be found. Patient was given AVS information for education on leg cramps. Discussed making sure she is replacing water/hydrating adequately. Discussed the possibility of electrolyte imbalance versus vitamin D deficiency versus electrolyte disorder etc. Patient was mildly hyperreflexive on exam. - Patient had good circulation on exam today. - Magnesium - Comp Met (CMET) - PTH, Intact and Calcium - CBC - TSH - B12 - Ferritin  2. Vitamin D deficiency - Vitamin D (25 hydroxy) - If needed prescribe supplement will be called in, otherwise patient encouraged to take 800 units daily.  3. Health maintenance:  Colonoscopy: Personal history of colon polyps, follows with Dr. Hilarie Fredrickson, last colonoscopy 2015 with a five-year follow-up recommended. Mammogram: Patient has gynecologist, Dr. [facility], mammogram showed some mild calcifications today, patient was encouraged to follow-up in 6 months with repeat exam. She receives her studies through [facility]. Cervical cancer screening: Follows with gynecology, last Pap smear 2016. Immunizations: Tetanus vaccination up-to-date 2011, declines flu shot. Infectious disease screening: HIV up-to-date  - Collection of lab work today, depending upon results, or worsening of symptoms patient to be seen sooner. If labs are normal follow-up when necessary

## 2015-08-31 NOTE — Patient Instructions (Signed)
Leg Cramps Leg cramps occur when a muscle or muscles tighten and you have no control over this tightening (involuntary muscle contraction). Muscle cramps can develop in any muscle, but the most common place is in the calf muscles of the leg. Those cramps can occur during exercise or when you are at rest. Leg cramps are painful, and they may last for a few seconds to a few minutes. Cramps may return several times before they finally stop. Usually, leg cramps are not caused by a serious medical problem. In many cases, the cause is not known. Some common causes include:  Overexertion.  Overuse from repetitive motions, or doing the same thing over and over.  Remaining in a certain position for a long period of time.  Improper preparation, form, or technique while performing a sport or an activity.  Dehydration.  Injury.  Side effects of some medicines.  Abnormally low levels of the salts and ions in your blood (electrolytes), especially potassium and calcium. These levels could be low if you are taking water pills (diuretics) or if you are pregnant. HOME CARE INSTRUCTIONS Watch your condition for any changes. Taking the following actions may help to lessen any discomfort that you are feeling:  Stay well-hydrated. Drink enough fluid to keep your urine clear or pale yellow.  Try massaging, stretching, and relaxing the affected muscle. Do this for several minutes at a time.  For tight or tense muscles, use a warm towel, heating pad, or hot shower water directed to the affected area.  If you are sore or have pain after a cramp, applying ice to the affected area may relieve discomfort.  Put ice in a plastic bag.  Place a towel between your skin and the bag.  Leave the ice on for 20 minutes, 2-3 times per day.  Avoid strenuous exercise for several days if you have been having frequent leg cramps.  Make sure that your diet includes the essential minerals for your muscles to work  normally.  Take medicines only as directed by your health care provider. SEEK MEDICAL CARE IF:  Your leg cramps get more severe or more frequent, or they do not improve over time.  Your foot becomes cold, numb, or blue.   This information is not intended to replace advice given to you by your health care provider. Make sure you discuss any questions you have with your health care provider.   Document Released: 08/24/2004 Document Revised: 12/01/2014 Document Reviewed: 06/24/2014 Elsevier Interactive Patient Education [employer].   I will call you with the results of labs once available.

## 2015-09-01 ENCOUNTER — Telehealth: Payer: Self-pay | Admitting: Family Medicine

## 2015-09-01 DIAGNOSIS — E559 Vitamin D deficiency, unspecified: Secondary | ICD-10-CM

## 2015-09-01 DIAGNOSIS — E538 Deficiency of other specified B group vitamins: Secondary | ICD-10-CM | POA: Insufficient documentation

## 2015-09-01 LAB — PTH, INTACT AND CALCIUM
CALCIUM: 9.5 mg/dL (ref 8.4–10.5)
PTH: 42 pg/mL (ref 14–64)

## 2015-09-01 MED ORDER — CHOLECALCIFEROL 1.25 MG (50000 UT) PO CAPS: 50000.0000 [IU] | ORAL_CAPSULE | ORAL | Status: DC

## 2015-09-01 MED ORDER — VITAMIN B-12 1000 MCG PO TABS: ORAL_TABLET | ORAL | Status: DC

## 2015-09-01 NOTE — Telephone Encounter (Signed)
Spoke with patient reviewed lab results and instructions. Patient states she has no insurance and is not sure if she can afford medication. She will call back if this is an issue.

## 2015-09-01 NOTE — Telephone Encounter (Signed)
Please call patient concerning her lab results: - Her electrolytes appear normal. Her magnesium was normal. - She has no signs of anemia, and her iron is normal. - Patient has rather low vitamin D (19), and will need a higher dose prescribed medications I have called in for her to take 2 times a week for the next 8 weeks, and then I need to retest her vitamin D  to ensure she is absorbing. Retest by lab appt. Only. Order placed.  - Patient also has a rather low B-12 260. This should be replaced and I have also called in a medication to taper over 8 weeks (orally) and then we will retest at the same times as vitamin D.  - Both of the above could cause the discomfort she is experiencing in her legs. - After prescribed supplements, and levels are normal by lab, pt should be encouraged to take daily Vit D 800u and B12 (usually 171mcg).

## 2015-09-01 NOTE — Telephone Encounter (Signed)
Left message for patient to return call to review lab results and instructions. 

## 2015-09-14 ENCOUNTER — Telehealth: Payer: Self-pay | Admitting: *Deleted

## 2015-09-14 NOTE — Telephone Encounter (Signed)
Patient called states due to no insurance she was unable to get Rx for Vit D she is taking OTC Vit D3 she was taking 10,000 units daily but felt is was causing her to have dry mouth and frequent urination. She states she is not having any urinary tract symptoms. Patient states she is cutting her dosing back to 5000 units daily and will make an appt if she continues to have symptoms. She will follow up in 8 weeks to see if Vit D level has improved.

## 2015-12-31 ENCOUNTER — Encounter: Payer: Self-pay | Admitting: Family Medicine

## 2015-12-31 ENCOUNTER — Ambulatory Visit (INDEPENDENT_AMBULATORY_CARE_PROVIDER_SITE_OTHER): Payer: Commercial Managed Care - HMO | Admitting: Family Medicine

## 2015-12-31 VITALS — BP 124/84 | HR 82 | Temp 98.3°F | Resp 20 | Ht 69.75 in | Wt 140.8 lb

## 2015-12-31 DIAGNOSIS — R300 Dysuria: Secondary | ICD-10-CM

## 2015-12-31 DIAGNOSIS — N912 Amenorrhea, unspecified: Secondary | ICD-10-CM

## 2015-12-31 DIAGNOSIS — N926 Irregular menstruation, unspecified: Secondary | ICD-10-CM | POA: Diagnosis not present

## 2015-12-31 LAB — POC URINALSYSI DIPSTICK (AUTOMATED)
Bilirubin, UA: NEGATIVE
Glucose, UA: NEGATIVE
KETONES UA: NEGATIVE
Leukocytes, UA: NEGATIVE
Nitrite, UA: NEGATIVE
PROTEIN UA: NEGATIVE
Spec Grav, UA: 1.015
UROBILINOGEN UA: 0.2
pH, UA: 7

## 2015-12-31 LAB — POCT URINE PREGNANCY: PREG TEST UR: NEGATIVE

## 2015-12-31 NOTE — Progress Notes (Signed)
Patient ID: Heather Briggs, female   DOB: Mar 30, 1971, 45 y.o.   MRN: [id number]    Heather Briggs , 22-Dec-1970, 45 y.o., female MRN: [id number]  CC: amenorrhea Subjective:   Amenorrhea and dysuria: She presents for an acute office visit complaints of amenorrhea. She states her last menstrual cycle was April 17. She has noticed irregularity in her menstrual periods since before wintertime last year. At that time she had noticed she was having more frequent menstrual cycles up to 2 times a month. She will would have approximately 5 days of menses, of light to moderate bleeding. She states she has lost 7 pounds, but she has been exercising more frequently, although she wasn't trying to lose weight. She does have a history of IBS. She has been increasing the protein in her diet. She endorses increased stress, her husband had lost his job around January, he has recently found work. She reports her mother had gone through the change approximately at this age. She endorses nausea and urinary frequency. She denies breast tenderness, fatigue, mood swings, hot flashes or dyspareunia. She reports she has taken 3 digital home pregnancy tests and they have all been negative. She endorses some cramping and suprapubic pressure.   Allergies  Allergen Reactions  . Ciprofloxacin Other (See Comments)    Adverse reactions  . Prochlorperazine Edisylate     Unsure of reaction  . Penicillins Rash  . Sulfonamide Derivatives Rash   Social History  Substance Use Topics  . Smoking status: Former Smoker    Types: Cigarettes  . Smokeless tobacco: Never Used  . Alcohol Use: Yes     Comment: social   Past Medical History  Diagnosis Date  . Heart palpitations     typically occur at bedtime  . Anxiety   . Hemorrhoids   . Tubular adenoma of colon    Past Surgical History  Procedure Laterality Date  . Cholecystectomy    . Cesarean section  2004  . Hemorroidectomy    . Hemorrhoid surgery    . Breast biopsy Right     Family History  Problem Relation Age of Onset  . Hypertension Mother   . Hyperlipidemia Mother   . Colon polyps Mother     benign  . Hypertension Father   . Diabetes Father 8  . Heart disease Father   . Colon cancer Neg Hx   . Esophageal cancer Neg Hx   . Stomach cancer Neg Hx   . Rectal cancer Neg Hx   . Breast cancer Neg Hx      Medication List       This list is accurate as of: 12/31/15  9:13 AM.  Always use your most recent med list.               MAGNESIUM PO  Take 1 tablet by mouth as needed.     TYLENOL PO  Take by mouth as needed.     vitamin B-12 1000 MCG tablet  Commonly known as:  CYANOCOBALAMIN  1000 mcg daily for 7 days, then once weekly for 7 weeks.     VITAMIN D PO  Take 1 tablet by mouth as needed. Reported on 08/31/2015        ROS: Negative, with the exception of above mentioned in HPI  Objective:  BP 124/84 mmHg  Pulse 82  Temp(Src) 98.3 F (36.8 C)  Resp 20  Ht 5' 9.75" (1.772 m)  Wt 140 lb 12 oz (63.844 kg)  BMI 20.33 kg/m2  SpO2 100% Body mass index is 20.33 kg/(m^2). Gen: Afebrile. No acute distress. Nontoxic in appearance, well-developed, well-nourished, Caucasian female. Pleasant. HENT: AT. [place].  MMM, no oral lesions.  Eyes:Pupils Equal Round Reactive to light, Extraocular movements intact,  Conjunctiva without redness, discharge or icterus. Neck/lymp/endocrine: Supple, no lymphadenopathy CV: RRR Chest: CTAB, no wheeze or crackles.  Abd: Soft. Flat. NTND. BS present. No Masses palpated. No rebound or guarding.  Neuro: Normal gait. PERLA. EOMi. Alert. Oriented x3  Psych: Normal affect, dress and demeanor. Normal speech. Normal thought content and judgment  Assessment/Plan: Heather Briggs is a 45 y.o. female present for acute OV for  Missed menses/Amenorrhea - Discussed with patient as possibly could be the onset perimenopausal state. She does not endorse any other symptoms such as hot flashes, mood swings etc. - Discussed  collection of labs to check hormone levels although this is not necessarily predictive. Ultrasound ordered today . - POCT urine pregnancy--> negative - FSH/LH; Future - Estradiol; Future - TSH; Future - T4, free; Future - VITAMIN D 25 Hydroxy (Vit-D Deficiency, Fractures); Future - US Transvaginal Non-OB; Future - US Pelvis Complete; Future - Follow-up depending upon results. - Consider gynecology referral versus hormone/birth control pills to regulate  Burning with urination - POCT Urinalysis Dipstick (Automated)--> Negative, trace hemolyzed RBC  electronically signed by:  Howard Pouch, DO  [place]

## 2015-12-31 NOTE — Patient Instructions (Signed)
I have placed labs and [place] for [facility]. Please schedule these at your earliest convenience.  Your preganancy test is negative and your urine is negative for infection today.   Primary Amenorrhea Primary amenorrhea is the absence of any menstrual flow in a female by the age of 50 years. An average age for the start of menstruation is the age of 64 years. Primary amenorrhea is not considered to have occurred until a female is older than 15 years and has never menstruated. This may occur with or without other signs of puberty. CAUSES  Some common causes of not menstruating include:  Chromosomal abnormality causing the ovaries to malfunction is the most common cause of primary amenorrhea.  Malnutrition.  Low blood sugar (hypoglycemia).  Polycystic ovary syndrome (cysts in the ovaries, not ovulating).  Absence of the vagina, uterus, or ovaries since birth (congenital).  Extreme obesity.  Cystic fibrosis.  Drastic weight loss from any cause.  Over-exercising (running, biking) causing loss of body fat.  Pituitary gland tumor in the brain.  Long-term (chronic) illnesses.  Cushing disease.  Thyroid disease (hypothyroidism, hyperthyroidism).  Part of the brain (hypothalamus) not functioning normally.  Premature ovarian failure. SYMPTOMS  No menstruation by age 30 years in normally developed females is the primary symptom. Other symptoms may include:  Discharge from the breasts.  Hot flashes.  Adult acne.  Facial or chest hair.  Headaches.  Impaired vision.  Recent stress.  Changes in weight, diet, or exercise patterns. DIAGNOSIS  Primary amenorrhea is diagnosed with the help of a medical history and a physical exam. Other tests that may be recommended include:  Blood tests to check for pregnancy, hormonal changes, a bleeding or thyroid disorder, low iron levels (anemia), or other problems.  Urine tests.  Specialized X-ray exams. TREATMENT  Treatment will  depend on the cause. For example, some of the causes of primary amenorrhea, such as congenital absence of sex organs, will require surgery to correct. Others may respond to treatment with medicine. SEEK MEDICAL CARE IF:  There has not been any menstrual flow by age 59 years.  Body maturation does not occur at a level typical of peers.  Pelvic area pain occurs.  There is unusual weight gain or hair growth.   This information is not intended to replace advice given to you by your health care provider. Make sure you discuss any questions you have with your health care provider.   Document Released: 07/17/2005 Document Revised: 08/07/2014 Document Reviewed: 02/26/2013 Elsevier Interactive Patient Education [employer].

## 2016-01-03 ENCOUNTER — Other Ambulatory Visit: Payer: Self-pay | Admitting: Family Medicine

## 2016-01-03 ENCOUNTER — Ambulatory Visit (HOSPITAL_BASED_OUTPATIENT_CLINIC_OR_DEPARTMENT_OTHER)
Admission: RE | Admit: 2016-01-03 | Discharge: 2016-01-03 | Disposition: A | Discharge status: Home / Self Care | Payer: Commercial Managed Care - HMO | Source: Ambulatory Visit | Attending: Family Medicine | Admitting: Family Medicine

## 2016-01-03 ENCOUNTER — Other Ambulatory Visit (INDEPENDENT_AMBULATORY_CARE_PROVIDER_SITE_OTHER): Payer: Commercial Managed Care - HMO

## 2016-01-03 DIAGNOSIS — N912 Amenorrhea, unspecified: Secondary | ICD-10-CM | POA: Insufficient documentation

## 2016-01-03 DIAGNOSIS — D261 Other benign neoplasm of corpus uteri: Secondary | ICD-10-CM | POA: Diagnosis not present

## 2016-01-03 DIAGNOSIS — N888 Other specified noninflammatory disorders of cervix uteri: Secondary | ICD-10-CM | POA: Insufficient documentation

## 2016-01-03 LAB — TSH: TSH: 0.87 u[IU]/mL (ref 0.35–4.50)

## 2016-01-03 LAB — FSH/LH
FSH: 101.6 m[IU]/mL
LH: 109.7 m[IU]/mL — ABNORMAL HIGH

## 2016-01-03 LAB — ESTRADIOL: Estradiol: 148 pg/mL

## 2016-01-03 LAB — T4, FREE: Free T4: 0.9 ng/dL (ref 0.60–1.60)

## 2016-01-03 LAB — VITAMIN D 25 HYDROXY (VIT D DEFICIENCY, FRACTURES): VITD: 40.08 ng/mL (ref 30.00–100.00)

## 2016-01-04 ENCOUNTER — Telehealth: Payer: Self-pay | Admitting: Family Medicine

## 2016-01-04 DIAGNOSIS — N912 Amenorrhea, unspecified: Secondary | ICD-10-CM

## 2016-01-04 MED ORDER — MEDROXYPROGESTERONE ACETATE 10 MG PO TABS: 10.0000 mg | ORAL_TABLET | Freq: Every day | ORAL | Status: DC

## 2016-01-04 NOTE — Telephone Encounter (Signed)
-   Her [place] looks basically normal. Small fibroid. No causes for amenorrhea. Her uterine lining looks good.  - Her labs indicated possibly perimenopausal state starting. [place] high, E2 normal. TSH normal. - Discussed progesterone challenge, pt amendable to try, discussed proper use/response.  - add prolactin level - if no menses will need to see GYN

## 2016-01-05 LAB — PROLACTIN: PROLACTIN: 6.4 ng/mL

## 2016-04-24 ENCOUNTER — Ambulatory Visit (INDEPENDENT_AMBULATORY_CARE_PROVIDER_SITE_OTHER): Payer: Commercial Managed Care - HMO | Admitting: Family Medicine

## 2016-04-24 ENCOUNTER — Telehealth: Payer: Self-pay | Admitting: Family Medicine

## 2016-04-24 ENCOUNTER — Encounter: Payer: Self-pay | Admitting: Family Medicine

## 2016-04-24 VITALS — BP 127/79 | HR 77 | Temp 98.1°F | Resp 20 | Ht 70.0 in | Wt 152.5 lb

## 2016-04-24 DIAGNOSIS — N921 Excessive and frequent menstruation with irregular cycle: Secondary | ICD-10-CM | POA: Diagnosis not present

## 2016-04-24 DIAGNOSIS — N938 Other specified abnormal uterine and vaginal bleeding: Secondary | ICD-10-CM

## 2016-04-24 LAB — CBC WITH DIFFERENTIAL/PLATELET
BASOS ABS: 0.1 10*3/uL (ref 0.0–0.1)
Basophils Relative: 1 % (ref 0.0–3.0)
Eosinophils Absolute: 0 10*3/uL (ref 0.0–0.7)
Eosinophils Relative: 0.5 % (ref 0.0–5.0)
HCT: 37.7 % (ref 36.0–46.0)
Hemoglobin: 12.7 g/dL (ref 12.0–15.0)
LYMPHS ABS: 2 10*3/uL (ref 0.7–4.0)
Lymphocytes Relative: 25.7 % (ref 12.0–46.0)
MCHC: 33.7 g/dL (ref 30.0–36.0)
MCV: 90.8 fl (ref 78.0–100.0)
MONO ABS: 0.7 10*3/uL (ref 0.1–1.0)
MONOS PCT: 8.6 % (ref 3.0–12.0)
NEUTROS PCT: 64.2 % (ref 43.0–77.0)
Neutro Abs: 5 10*3/uL (ref 1.4–7.7)
Platelets: 276 10*3/uL (ref 150.0–400.0)
RBC: 4.16 Mil/uL (ref 3.87–5.11)
RDW: 13.3 % (ref 11.5–15.5)
WBC: 7.8 10*3/uL (ref 4.0–10.5)

## 2016-04-24 NOTE — Progress Notes (Addendum)
Patient ID: Heather Briggs, female   DOB: [birth year], 45 y.o.   MRN: [id number]    Heather Briggs , 06/14/1971, 45 y.o., female MRN: [id number]  CC: amenorrhea Subjective:   Patient Care Team    Relationship Specialty Notifications Start End  Ma Hillock, DO PCP - General Family Medicine  12/31/15   Dian Queen, MD Consulting Physician Obstetrics and Gynecology  04/24/16      Irregular menses/dysfunctional vaginal bleeding : Pt presented in June for amenorrhea. Since that time she has had a Progesterone challenge in June, which was  effective. She had a normal cycle in July. August cycle was about a week late, and lasted 5 days. None of the above menses were heavy in nature. September menses started came early with spotting around the 3rd, and then heavy bleeding heavy since the 57 th. The time period between August and September was about 2 weeks. She does endorse mild fatigue. She reports her mother does not recall the age of onset of her peri-menopauseal state, but she did recall her menses became very heavy. Patient denies cramping, headache, dizziness, palpitations or presyncope. Patient has had TSH, prolactin, LH/FSH/E2, [place] and PAP since June. All studies WNL with the exception of elevated FSH/LH and a small fibroid 28mm.    Prior note:  She presents for an acute office visit complaints of amenorrhea. She states her last menstrual cycle was April 17. She has noticed irregularity in her menstrual periods since before wintertime last year. At that time she had noticed she was having more frequent menstrual cycles up to 2 times a month. She will would have approximately 5 days of menses, of light to moderate bleeding. She states she has lost 7 pounds, but she has been exercising more frequently, although she wasn't trying to lose weight. She does have a history of IBS. She has been increasing the protein in her diet. She endorses increased stress, her husband had lost his job around January, he has  recently found work. She reports her mother had gone through the change approximately at this age. She endorses nausea and urinary frequency. She denies breast tenderness, fatigue, mood swings, hot flashes or dyspareunia. She reports she has taken 3 digital home pregnancy tests and they have all been negative. She endorses some cramping and suprapubic pressure.   Allergies  Allergen Reactions  . Ciprofloxacin Other (See Comments)    Adverse reactions  . Prochlorperazine Edisylate     Unsure of reaction  . Penicillins Rash  . Sulfonamide Derivatives Rash   Social History  Substance Use Topics  . Smoking status: Former Smoker    Types: Cigarettes  . Smokeless tobacco: Never Used  . Alcohol use Yes     Comment: social   Past Medical History:  Diagnosis Date  . Anxiety   . Heart palpitations    typically occur at bedtime  . Hemorrhoids   . Tubular adenoma of colon    Past Surgical History:  Procedure Laterality Date  . BREAST BIOPSY Right   . CESAREAN SECTION  2004  . CHOLECYSTECTOMY    . HEMORRHOID SURGERY    . HEMORROIDECTOMY     Family History  Problem Relation Age of Onset  . Hypertension Mother   . Hyperlipidemia Mother   . Colon polyps Mother     benign  . Hypertension Father   . Diabetes Father 33  . Heart disease Father   . Colon cancer Neg Hx   . Esophageal cancer Neg Hx   .  Stomach cancer Neg Hx   . Rectal cancer Neg Hx   . Breast cancer Neg Hx      Medication List       Accurate as of 04/24/16  9:59 AM. Always use your most recent med list.          MAGNESIUM PO Take 1 tablet by mouth as needed.   OVER THE COUNTER MEDICATION Take 1 capsule by mouth daily. Multivitamin with iron   TYLENOL PO Take by mouth as needed.   vitamin B-12 1000 MCG tablet Commonly known as:  CYANOCOBALAMIN 1000 mcg daily for 7 days, then once weekly for 7 weeks.   VITAMIN D PO Take 1 tablet by mouth as needed. Reported on 08/31/2015       ROS: Negative, with  the exception of above mentioned in HPI  Objective:  BP 127/79 (BP Location: Left Arm, Patient Position: Sitting, Cuff Size: Normal)   Pulse 77   Temp 98.1 F (36.7 C)   Resp 20   Ht 5\' 10"  (1.778 m)   Wt 152 lb 8 oz (69.2 kg)   LMP 04/10/2016 (Approximate)   SpO2 97%   BMI 21.88 kg/m  Body mass index is 21.88 kg/m. Gen: Afebrile. No acute distress. Nontoxic in appearance, well-developed, well-nourished, Caucasian female. Very Pleasant. HENT: AT. [place].  MMM, no oral lesions.  Eyes:Pupils Equal Round Reactive to light, Extraocular movements intact,  Conjunctiva without redness, discharge or icterus. Neck/lymp/endocrine: Supple, no lymphadenopathy CV: RRR Chest: CTAB, no wheeze or crackles.  Abd: Soft. Flat. NTND. BS present. No Masses palpated. No rebound or guarding.  Neuro: Normal gait. PERLA. EOMi. Alert. Oriented x3  Psych: Normal affect, dress and demeanor. Normal speech. Normal thought content and judgment  Assessment/Plan: [name] is a 45 y.o. female present for acute OV for  DUB (dysfunctional uterine bleeding)/Menorrhagia with irregular cycle - CBC w/Diff: experiencing some fatigue with heavy menses.  - Discussed DUB and AVS provided. Pt to track her periods, suggested smart phone app.  - She is not desiring oral BCP at this time.  - [place] 12/2015 and PAP (at gyn) normal. Only 1 very small fibroid noted. Hormones at that suggested peri-menopausal state. - Possibly will need to return to gyn for further options if bleeding remains heavy.  - F/u PRN   - pt declined flu shot today.   > 25 minutes spent with patient, >50% of time spent face to face counseling patient and coordinating care.  electronically signed by:  Howard Pouch, DO  [place]

## 2016-04-24 NOTE — Patient Instructions (Signed)

## 2016-04-24 NOTE — Telephone Encounter (Signed)
Please call Heather Briggs: - her labs do not show signs of anemia.  - Remind her to keep track of cycles closely. If increased bleeding or irregular bleeding continues, would suggest she talk to her gynecologist about her options since she is no tinterested in BCP.

## 2016-04-25 NOTE — Telephone Encounter (Signed)
Left message with lab results and detailed instructions on patient voice mail per DPR. 

## 2017-01-20 IMAGING — US US PELVIS COMPLETE
1 series · 13 of 25 positions shown · non-contrast
Comparison: No recent pelvic ultrasound studies in PACs.

CLINICAL DATA: Two weeks of left lower quadrant pain, history of
ovarian cysts 20 years ago, amenorrhea for the past 2 months

EXAM:
TRANSABDOMINAL AND TRANSVAGINAL ULTRASOUND OF PELVIS
TECHNIQUE: Both transabdominal and transvaginal ultrasound examinations of the
pelvis were performed. Transabdominal technique was performed for
global imaging of the pelvis including uterus, ovaries, adnexal
regions, and pelvic cul-de-sac. It was necessary to proceed with
endovaginal exam following the transabdominal exam to visualize the
endometrium, ovaries, and adnexal regions.

[Series 1: us pelvis complete · 0.24mm/px · 13 of 49 slices shown]
[im 1/49]
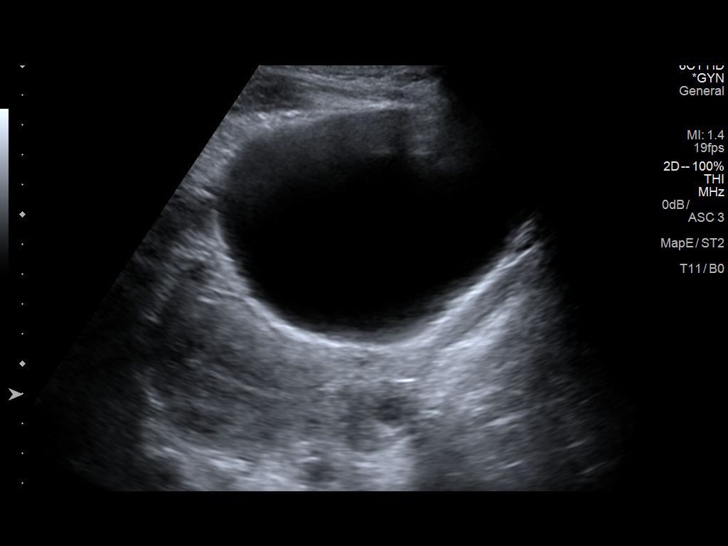
[im 5/49]
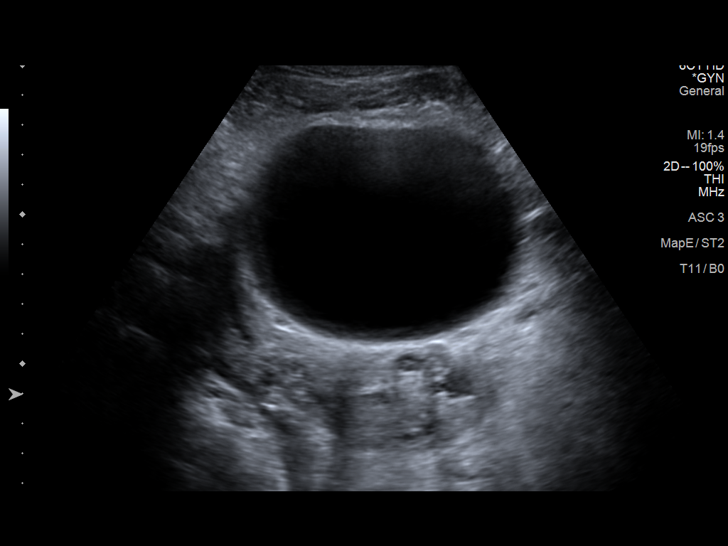
[im 9/49]
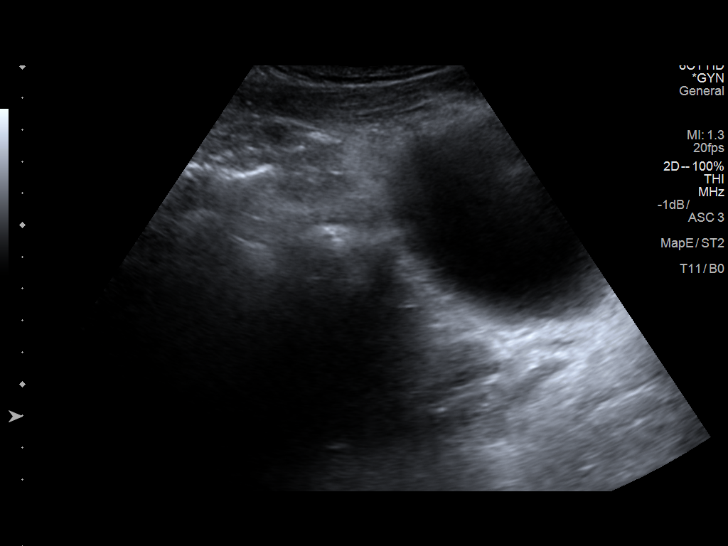
[im 13/49]
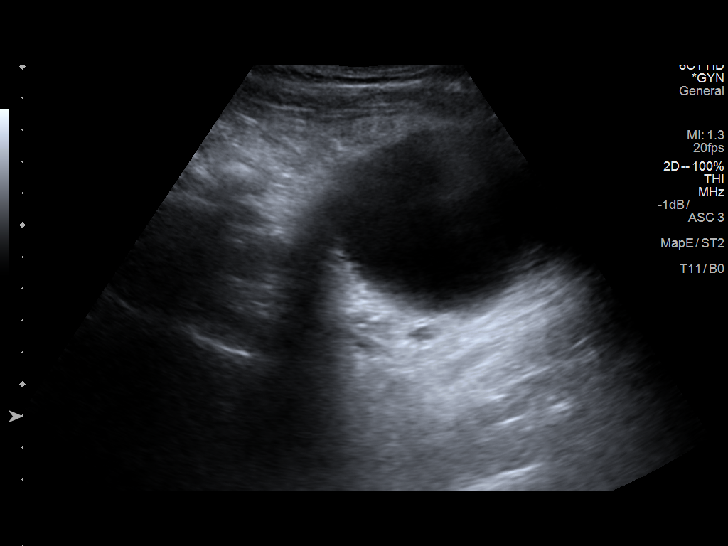
[im 17/49]
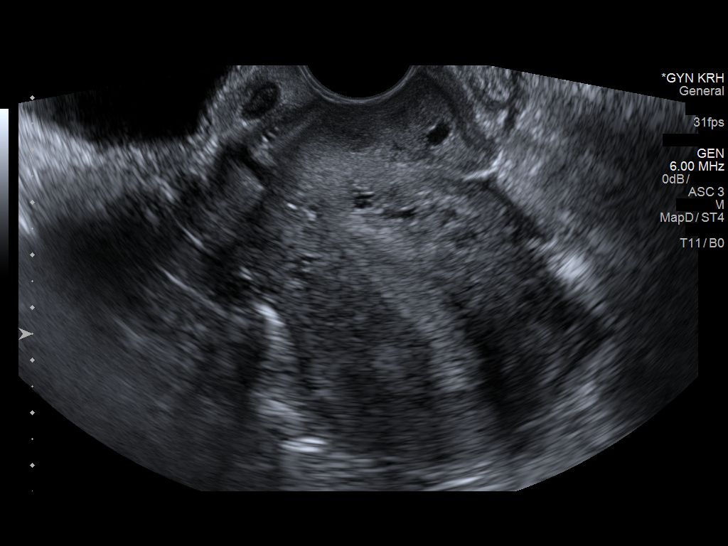
[im 21/49]
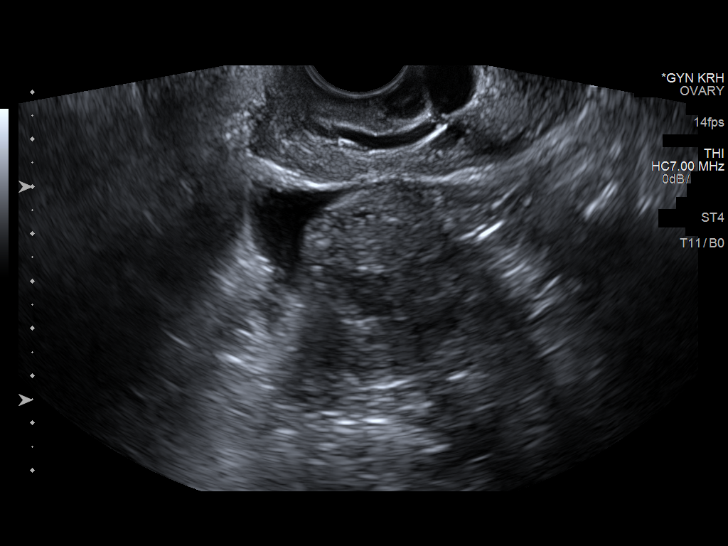
[im 25/49]
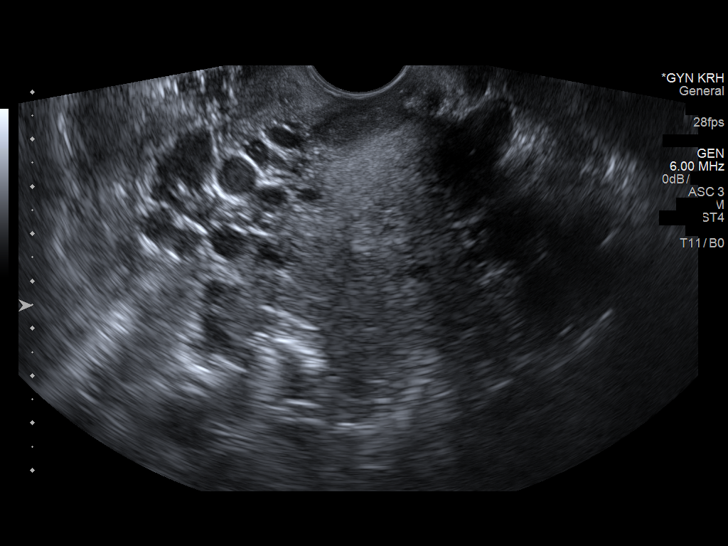
[im 29/49]
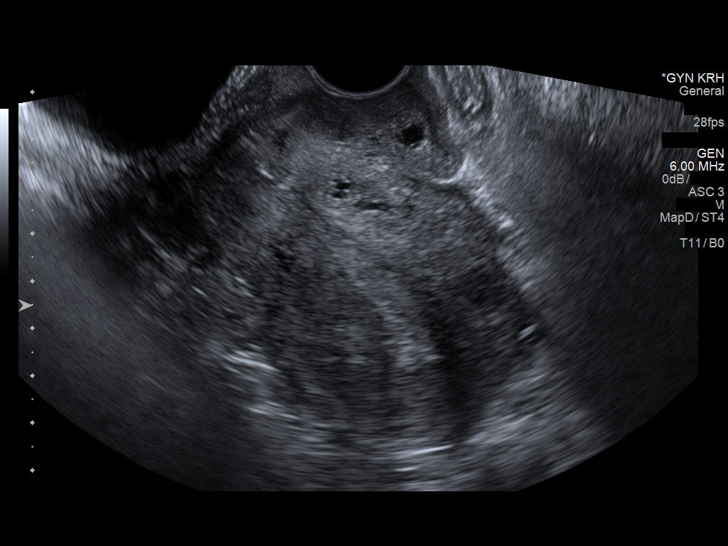
[im 33/49]
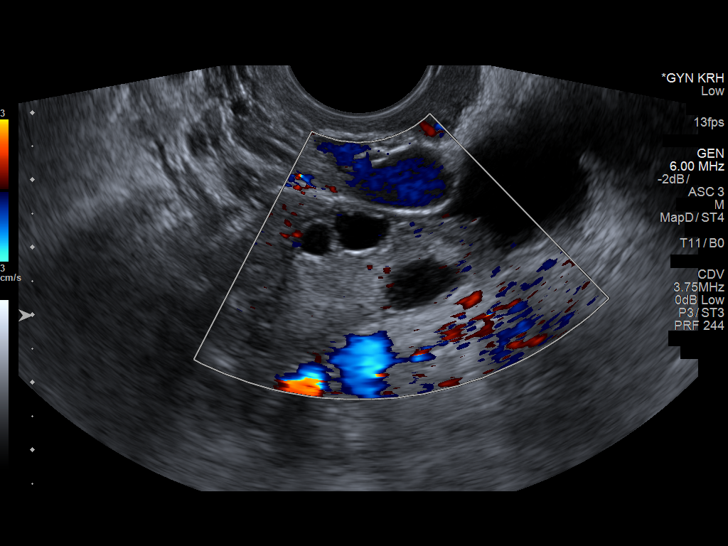
[im 37/49]
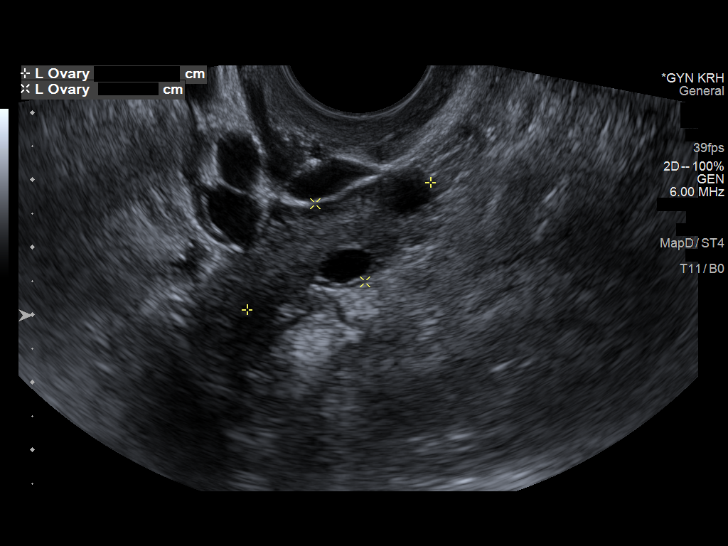
[im 41/49]
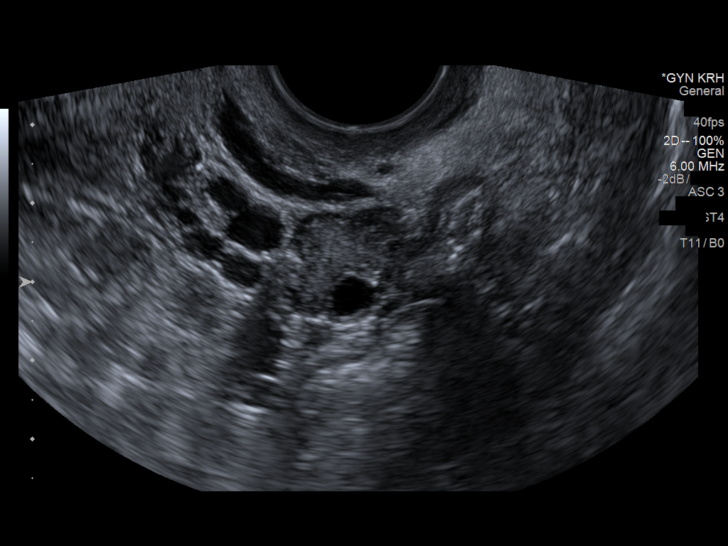
[im 45/49]
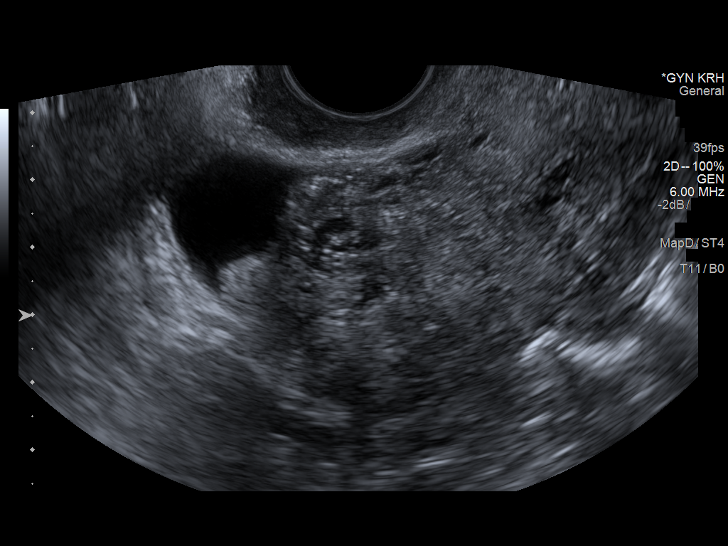
[im 49/49]
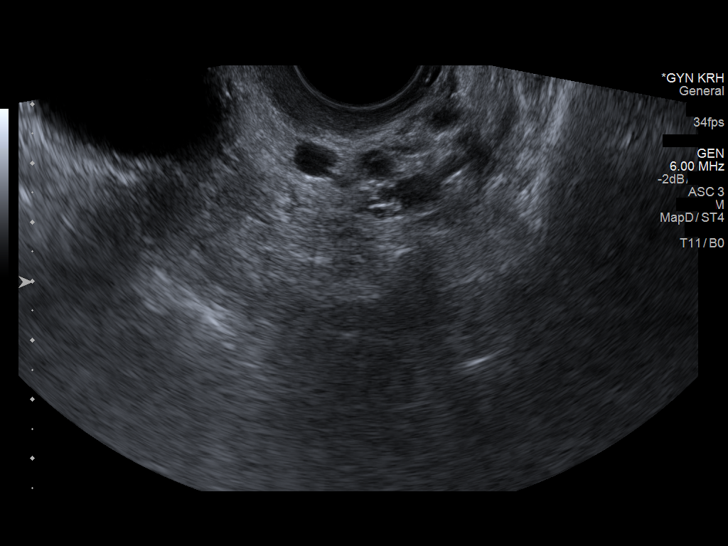

[13 of 25 positions shown; findings below may reference images not displayed]

FINDINGS: Uterus

Measurements: 8.8 x 5.3 x 5.7 cm. The myometrium appears normal with
the exception of an 11 mm fundal fibroid. The uterus is retroverted.
There are nabothian cysts.

Endometrium

Thickness: 11 mm.  No focal abnormality visualized.

Right ovary

Measurements: 3.4 x 1.8 x 2 cm. Normal appearance/no adnexal mass.
Numerous developing follicles

Left ovary

Measurements: 3.3 x 1.4 x 1.6 cm. Normal appearance/no adnexal mass.
Numerous developing follicles

Other findings

There is a small amount of free pelvic fluid in the right adnexal
region.
IMPRESSION: 1. The endometrial stripe appears normal at 11 mm. No endometrial
mass or fluid collections are observed.
2. Single fundal fibroid measuring up to 11 mm in diameter. Multiple
nabothian cysts.
3. Developing follicles in both ovaries. No suspicious adnexal
masses. Small amount of free fluid in the right adnexal region.

## 2017-09-11 ENCOUNTER — Ambulatory Visit: Payer: BLUE CROSS/BLUE SHIELD | Admitting: Family Medicine

## 2017-09-11 ENCOUNTER — Encounter: Payer: Self-pay | Admitting: Family Medicine

## 2017-09-11 VITALS — BP 143/87 | HR 97 | Temp 98.0°F | Ht 70.0 in | Wt 150.1 lb

## 2017-09-11 DIAGNOSIS — J40 Bronchitis, not specified as acute or chronic: Secondary | ICD-10-CM | POA: Diagnosis not present

## 2017-09-11 MED ORDER — METHYLPREDNISOLONE ACETATE 80 MG/ML IJ SUSP
80.0000 mg | Freq: Once | INTRAMUSCULAR | Status: AC
  Administered 2017-09-11: 80 mg via INTRAMUSCULAR

## 2017-09-11 MED ORDER — AZITHROMYCIN 250 MG PO TABS: ORAL_TABLET | ORAL | 0 refills | Status: DC

## 2017-09-11 NOTE — Patient Instructions (Signed)
Rest, hydrate.  mucinex (DM if cough), nettie pot or nasal saline.  Z-pack prescribed, take until completed.  If cough present it can last up to 6-8 weeks.  F/U 2 weeks of not improved.    Acute Bronchitis, Adult Acute bronchitis is when air tubes (bronchi) in the lungs suddenly get swollen. The condition can make it hard to breathe. It can also cause these symptoms:  A cough.  Coughing up clear, yellow, or green mucus.  Wheezing.  Chest congestion.  Shortness of breath.  A fever.  Body aches.  Chills.  A sore throat.  Follow these instructions at home: Medicines  Take over-the-counter and prescription medicines only as told by your doctor.  If you were prescribed an antibiotic medicine, take it as told by your doctor. Do not stop taking the antibiotic even if you start to feel better. General instructions  Rest.  Drink enough fluids to keep your pee (urine) clear or pale yellow.  Avoid smoking and secondhand smoke. If you smoke and you need help quitting, ask your doctor. Quitting will help your lungs heal faster.  Use an inhaler, cool mist vaporizer, or humidifier as told by your doctor.  Keep all follow-up visits as told by your doctor. This is important. How is this prevented? To lower your risk of getting this condition again:  Wash your hands often with soap and water. If you cannot use soap and water, use hand sanitizer.  Avoid contact with people who have cold symptoms.  Try not to touch your hands to your mouth, nose, or eyes.  Make sure to get the flu shot every year.  Contact a doctor if:  Your symptoms do not get better in 2 weeks. Get help right away if:  You cough up blood.  You have chest pain.  You have very bad shortness of breath.  You become dehydrated.  You faint (pass out) or keep feeling like you are going to pass out.  You keep throwing up (vomiting).  You have a very bad headache.  Your fever or chills gets  worse. This information is not intended to replace advice given to you by your health care provider. Make sure you discuss any questions you have with your health care provider. Document Released: 01/03/2008 Document Revised: 02/23/2016 Document Reviewed: 01/05/2016 Elsevier Interactive Patient Education  [employer].

## 2017-09-11 NOTE — Progress Notes (Signed)
Heather Briggs , 1970/12/06, 47 y.o., female MRN: [id number] Patient Care Team    Relationship Specialty Notifications Start End  Ma Hillock, DO PCP - General Family Medicine  12/31/15   Dian Queen, MD Consulting Physician Obstetrics and Gynecology  04/24/16     Chief Complaint  Patient presents with  . URI    pt c/o productive cough especially at night, congestion, ear drainig X 1 week.     Subjective: Pt presents for an OV with complaints of cough and fatigue of 2 weeks duration.  Associated symptoms include nasal congestion, fatigue, headache, decrease appetite and ear pressure. She has felt warm, but no fevers or chills.  Pt has tried mucinex to ease their symptoms. She believes she had the flu initially, but never recovered.   No flowsheet data found.  Allergies  Allergen Reactions  . Ciprofloxacin Other (See Comments)    Adverse reactions  . Prochlorperazine Edisylate     Unsure of reaction  . Penicillins Rash  . Sulfonamide Derivatives Rash   Social History   Tobacco Use  . Smoking status: Former Smoker    Types: Cigarettes  . Smokeless tobacco: Never Used  Substance Use Topics  . Alcohol use: Yes    Comment: social   Past Medical History:  Diagnosis Date  . Anxiety   . Heart palpitations    typically occur at bedtime  . Hemorrhoids   . Tubular adenoma of colon    Past Surgical History:  Procedure Laterality Date  . BREAST BIOPSY Right   . CESAREAN SECTION  2004  . CHOLECYSTECTOMY    . HEMORRHOID SURGERY    . HEMORROIDECTOMY     Family History  Problem Relation Age of Onset  . Hypertension Mother   . Hyperlipidemia Mother   . Colon polyps Mother        benign  . Hypertension Father   . Diabetes Father 11  . Heart disease Father   . Colon cancer Neg Hx   . Esophageal cancer Neg Hx   . Stomach cancer Neg Hx   . Rectal cancer Neg Hx   . Breast cancer Neg Hx    Allergies as of 09/11/2017      Reactions   Ciprofloxacin Other (See  Comments)   Adverse reactions   Prochlorperazine Edisylate    Unsure of reaction   Penicillins Rash   Sulfonamide Derivatives Rash      Medication List        Accurate as of 09/11/17 10:38 AM. Always use your most recent med list.          MAGNESIUM PO Take 1 tablet by mouth as needed.   OVER THE COUNTER MEDICATION Take 1 capsule by mouth daily. Multivitamin with iron   VITAMIN D PO Take 1 tablet by mouth as needed. Reported on 08/31/2015       All past medical history, surgical history, allergies, family history, immunizations andmedications were updated in the EMR today and reviewed under the history and medication portions of their EMR.     ROS: Negative, with the exception of above mentioned in HPI  Objective:  BP (!) 143/87 (BP Location: Right Arm, Patient Position: Sitting, Cuff Size: Normal)   Pulse 97   Temp 98 F (36.7 C) (Oral)   Ht 5\' 10"  (1.778 m)   Wt 150 lb 1.9 oz (68.1 kg)   LMP 07/17/2017   SpO2 100%   BMI 21.54 kg/m  Body mass index is 21.54  kg/m. Gen: Afebrile. No acute distress. Nontoxic in appearance, well developed, well nourished.  HENT: AT. [place]. Bilateral TM visualized fullness, no erythema. MMM, no oral lesions. Bilateral nares with mild erythema and drainage. Throat without erythema or exudates. Cough, no hoarseness.  Eyes:Pupils Equal Round Reactive to light, Extraocular movements intact,  Conjunctiva without redness, discharge or icterus. Neck/lymp/endocrine: Supple,no lymphadenopathy CV: RRR  Chest: Mild RUL wheeze, otherwise CTAB, no wheeze or crackles. Good air movement, normal resp effort.  Skin: no rashes, purpura or petechiae.  Neuro:  Normal gait. PERLA. EOMi. Alert. Oriented x3   No exam data present No results found. No results found for this or any previous visit (from the past 24 hour(s)).  Assessment/Plan: [name] is a 47 y.o. female present for OV for  Bronchitis:  - Rest, hydrate.   mucinex (DM if cough), nettie pot  or nasal saline.  Z-pack prescribed, take until completed.  IM depo medrol - declined prednisone.  Pt did not desire xray. Mild RUL crackle. CXR if sx not improved in 2 weeks or worsening.   If cough present it can last up to 6-8 weeks.  F/U 2 weeks of not improved.    Reviewed expectations re: course of current medical issues.  Discussed self-management of symptoms.  Outlined signs and symptoms indicating need for more acute intervention.  Patient verbalized understanding and all questions were answered.  Patient received an After-Visit Summary.    No orders of the defined types were placed in this encounter.    Note is dictated utilizing voice recognition software. Although note has been proof read prior to signing, occasional typographical errors still can be missed. If any questions arise, please do not hesitate to call for verification.   electronically signed by:  Howard Pouch, DO  [place]

## 2018-01-23 ENCOUNTER — Encounter: Payer: Self-pay | Admitting: Family Medicine

## 2018-01-23 ENCOUNTER — Ambulatory Visit: Payer: BLUE CROSS/BLUE SHIELD | Admitting: Family Medicine

## 2018-01-23 VITALS — BP 132/70 | HR 83 | Temp 97.8°F | Resp 20 | Ht 70.0 in | Wt 138.0 lb

## 2018-01-23 DIAGNOSIS — R5383 Other fatigue: Secondary | ICD-10-CM | POA: Diagnosis not present

## 2018-01-23 DIAGNOSIS — E559 Vitamin D deficiency, unspecified: Secondary | ICD-10-CM | POA: Diagnosis not present

## 2018-01-23 DIAGNOSIS — R198 Other specified symptoms and signs involving the digestive system and abdomen: Secondary | ICD-10-CM | POA: Diagnosis not present

## 2018-01-23 DIAGNOSIS — R634 Abnormal weight loss: Secondary | ICD-10-CM | POA: Diagnosis not present

## 2018-01-23 DIAGNOSIS — E538 Deficiency of other specified B group vitamins: Secondary | ICD-10-CM | POA: Diagnosis not present

## 2018-01-23 LAB — COMPREHENSIVE METABOLIC PANEL
ALT: 12 U/L (ref 0–35)
AST: 19 U/L (ref 0–37)
Albumin: 4.9 g/dL (ref 3.5–5.2)
Alkaline Phosphatase: 41 U/L (ref 39–117)
BILIRUBIN TOTAL: 1 mg/dL (ref 0.2–1.2)
BUN: 10 mg/dL (ref 6–23)
CHLORIDE: 101 meq/L (ref 96–112)
CO2: 30 meq/L (ref 19–32)
Calcium: 10.4 mg/dL (ref 8.4–10.5)
Creatinine, Ser: 0.83 mg/dL (ref 0.40–1.20)
GFR: 78.28 mL/min (ref >60.00)
GLUCOSE: 93 mg/dL (ref 70–99)
Potassium: 4.6 mEq/L (ref 3.5–5.1)
Sodium: 138 mEq/L (ref 135–145)
Total Protein: 8.1 g/dL (ref 6.0–8.3)

## 2018-01-23 LAB — CBC WITH DIFFERENTIAL/PLATELET
Basophils Absolute: 0 10*3/uL (ref 0.0–0.1)
Basophils Relative: 0.5 % (ref 0.0–3.0)
EOS ABS: 0 10*3/uL (ref 0.0–0.7)
Eosinophils Relative: 0.6 % (ref 0.0–5.0)
HCT: 44.9 % (ref 36.0–46.0)
Hemoglobin: 15 g/dL (ref 12.0–15.0)
LYMPHS ABS: 1.9 10*3/uL (ref 0.7–4.0)
Lymphocytes Relative: 31.1 % (ref 12.0–46.0)
MCHC: 33.3 g/dL (ref 30.0–36.0)
MCV: 86.5 fl (ref 78.0–100.0)
Monocytes Absolute: 0.6 10*3/uL (ref 0.1–1.0)
Monocytes Relative: 10.2 % (ref 3.0–12.0)
NEUTROS ABS: 3.6 10*3/uL (ref 1.4–7.7)
NEUTROS PCT: 57.6 % (ref 43.0–77.0)
PLATELETS: 240 10*3/uL (ref 150.0–400.0)
RBC: 5.19 Mil/uL — ABNORMAL HIGH (ref 3.87–5.11)
RDW: 15.3 % (ref 11.5–15.5)
WBC: 6.2 10*3/uL (ref 4.0–10.5)

## 2018-01-23 LAB — VITAMIN B12: VITAMIN B 12: 248 pg/mL (ref 211–911)

## 2018-01-23 LAB — T4, FREE: Free T4: 1.02 ng/dL (ref 0.60–1.60)

## 2018-01-23 LAB — T3, FREE: T3 FREE: 3.7 pg/mL (ref 2.3–4.2)

## 2018-01-23 LAB — TSH: TSH: 0.81 u[IU]/mL (ref 0.35–4.50)

## 2018-01-23 LAB — VITAMIN D 25 HYDROXY (VIT D DEFICIENCY, FRACTURES): VITD: 37.52 ng/mL (ref 30.00–100.00)

## 2018-01-23 LAB — H. PYLORI ANTIBODY, IGG: H Pylori IgG: NEGATIVE

## 2018-01-23 MED ORDER — FAMOTIDINE 20 MG PO TABS: 20.0000 mg | ORAL_TABLET | Freq: Every day | ORAL | 2 refills | Status: DC

## 2018-01-23 NOTE — Patient Instructions (Signed)
Start pepcid once a day. Small frequent meals, Avoid known triggers.   Food Choices for Gastroesophageal Reflux Disease, Adult When you have gastroesophageal reflux disease (GERD), the foods you eat and your eating habits are very important. Choosing the right foods can help ease your discomfort. What guidelines do I need to follow?  Choose fruits, vegetables, whole grains, and low-fat dairy products.  Choose low-fat meat, fish, and poultry.  Limit fats such as oils, salad dressings, butter, nuts, and avocado.  Keep a food diary. This helps you identify foods that cause symptoms.  Avoid foods that cause symptoms. These may be different for everyone.  Eat small meals often instead of 3 large meals a day.  Eat your meals slowly, in a place where you are relaxed.  Limit fried foods.  Cook foods using methods other than frying.  Avoid drinking alcohol.  Avoid drinking large amounts of liquids with your meals.  Avoid bending over or lying down until 2-3 hours after eating. What foods are not recommended? These are some foods and drinks that may make your symptoms worse: Vegetables Tomatoes. Tomato juice. Tomato and spaghetti sauce. Chili peppers. Onion and garlic. Horseradish. Fruits Oranges, grapefruit, and lemon (fruit and juice). Meats High-fat meats, fish, and poultry. This includes hot dogs, ribs, ham, sausage, salami, and bacon. Dairy Whole milk and chocolate milk. Sour cream. Cream. Butter. Ice cream. Cream cheese. Drinks Coffee and tea. Bubbly (carbonated) drinks or energy drinks. Condiments Hot sauce. Barbecue sauce. Sweets/Desserts Chocolate and cocoa. Donuts. Peppermint and spearmint. Fats and Oils High-fat foods. This includes [place] fries and potato chips. Other Vinegar. Strong spices. This includes black pepper, white pepper, red pepper, cayenne, curry powder, cloves, ginger, and chili powder. The items listed above may not be a complete list of foods and  drinks to avoid. Contact your dietitian for more information. This information is not intended to replace advice given to you by your health care provider. Make sure you discuss any questions you have with your health care provider. Document Released: 01/16/2012 Document Revised: 12/23/2015 Document Reviewed: 05/21/2013 Elsevier Interactive Patient Education  2017 [employer].

## 2018-01-23 NOTE — Progress Notes (Signed)
[place] , 07-Feb-1971, 47 y.o., female MRN: [id number] Patient Care Team    Relationship Specialty Notifications Start End  Ma Hillock, DO PCP - General Family Medicine  12/31/15   Dian Queen, MD Consulting Physician Obstetrics and Gynecology  04/24/16   Jerene Bears, MD Consulting Physician Gastroenterology  01/23/18     Chief Complaint  Patient presents with  . Gastroesophageal Reflux     Subjective: Pt presents for an OV with complaints of reflux-like symptoms.  She feels the symptoms started after her most recent occurrence of bronchitis 09/11/2017.  Bronchitis-like symptoms went away with the use of Z-Pak however increased phlegm production has remained.  Reports the phlegm production is worse after eating.  Occasionally feels this if the phlegm becomes "stuck "and points to mid esophagus area.  She changed her diet at the end of April/May to see if it would be helpful with her symptoms.  She has lost 12 pounds by exercising and decreasing the gluten and carbohydrates in her diet.  She is concerned that the weight dropped off very quickly, to the point she is no longer exercising.  She is fearful there is another cause of her weight loss other than the change in her diet.  Reports that eating definitely aggravates her symptoms, no certain foods associated.  She is sleeping with her head elevated on 2 pillows.  She is more tired than usual and more stressed.  She is wondering if her thyroid is functioning normal as well.  She has a history of B12 deficiency.  She has not started anything for her symptoms.  She is fearful of starting a PPI.  She has had a cholecystectomy.  Depression screen [facility] 2/9 01/23/2018  Decreased Interest 0  PHQ - 2 Score 0    Allergies  Allergen Reactions  . Ciprofloxacin Other (See Comments)    Adverse reactions  . Prochlorperazine Edisylate     Unsure of reaction  . Penicillins Rash  . Sulfonamide Derivatives Rash   Social History   Tobacco Use   . Smoking status: Former Smoker    Types: Cigarettes  . Smokeless tobacco: Never Used  Substance Use Topics  . Alcohol use: Yes    Comment: social   Past Medical History:  Diagnosis Date  . Anxiety   . Heart palpitations    typically occur at bedtime  . Hemorrhoids   . Tubular adenoma of colon    Past Surgical History:  Procedure Laterality Date  . BREAST BIOPSY Right   . CESAREAN SECTION  2004  . CHOLECYSTECTOMY    . HEMORRHOID SURGERY    . HEMORROIDECTOMY     Family History  Problem Relation Age of Onset  . Hypertension Mother   . Hyperlipidemia Mother   . Colon polyps Mother        benign  . Hypertension Father   . Diabetes Father 51  . Heart disease Father   . Colon cancer Neg Hx   . Esophageal cancer Neg Hx   . Stomach cancer Neg Hx   . Rectal cancer Neg Hx   . Breast cancer Neg Hx    Allergies as of 01/23/2018      Reactions   Ciprofloxacin Other (See Comments)   Adverse reactions   Prochlorperazine Edisylate    Unsure of reaction   Penicillins Rash   Sulfonamide Derivatives Rash      Medication List        Accurate as of 01/23/18  4:05 PM. Always use your most recent med list.          famotidine 20 MG tablet Commonly known as:  PEPCID Take 1 tablet (20 mg total) by mouth daily.   MAGNESIUM PO Take 1 tablet by mouth as needed.   OVER THE COUNTER MEDICATION Take 1 capsule by mouth daily. Multivitamin with iron   VITAMIN D PO Take 1 tablet by mouth as needed. Reported on 08/31/2015       All past medical history, surgical history, allergies, family history, immunizations andmedications were updated in the EMR today and reviewed under the history and medication portions of their EMR.     ROS: Negative, with the exception of above mentioned in HPI   Objective:  BP 132/70 (BP Location: Right Arm, Patient Position: Sitting, Cuff Size: Normal)   Pulse 83   Temp 97.8 F (36.6 C)   Resp 20   Ht '5\' 10"'$  (1.778 m)   Wt 138 lb (62.6 kg)    LMP 01/17/2018   SpO2 100%   BMI 19.80 kg/m  Body mass index is 19.8 kg/m. Gen: Afebrile. No acute distress. Nontoxic in appearance, well developed, well nourished.  Very pleasant, physically fit Caucasian female. HENT: AT. [place]. Bilateral TM visualized no erythema or fullness. MMM, no oral lesions. Bilateral nares no erythema, drainage or swelling. Throat without erythema or exudates.  Mild cough.  No postnasal drip. Eyes:Pupils Equal Round Reactive to light, Extraocular movements intact,  Conjunctiva without redness, discharge or icterus. Neck/lymp/endocrine: Supple, no lymphadenopathy, thyromegaly CV: RRR no murmur, no edema Chest: CTAB, no wheeze or crackles. Good air movement, normal resp effort.  Abd: Soft.  Flat.  Very mild discomfort right lower portion of her epigastric region. ND. BS presnet. no Masses palpated. No rebound or guarding.  Skin: no rashes, purpura or petechiae.  Neuro:  Normal gait. PERLA. EOMi. Alert. Oriented x3  Psych: Normal affect, dress and demeanor. Normal speech. Normal thought content and judgment.  No exam data present No results found. No results found for this or any previous visit (from the past 24 hour(s)).  Assessment/Plan: Jojo Geving is a 47 y.o. female present for OV for  B12 deficiency/Vit D def/fatigue -She is not taking either B12 or vitamin D routinely. - CBC w/Diff - Comp Met (CMET)  - B12 - Vitamin D (25 hydroxy) - TSH Unintentional weight loss/Symptoms of gastroesophageal reflux -No red flags on exam today.  Patient has changed her diet a great deal which could likely be the result of her 12 pound weight loss.  Her symptoms could be consistent with reflux given they are exacerbated by eating.  She does not desire to start a PPI.  We discussed the safety, she still would like to try other means. -Discussed dietary changes, behavioral changes to help control reflux.  Start Pepcid 1 tab daily for 2-4 weeks, if seeing improvement continue for 3  months or until symptoms resolved. -Reports she has fears of her having stomach cancer.  Does not desire to be on medications chronically for her condition. - H. pylori antibody, IgG - TSH - T4, free - T3, free -follow up in 4 weeks if symptoms are not improving, sooner if worsening.  SHe is established with Dr. Peri Jefferson, if needing referral.   Reviewed expectations re: course of current medical issues.  Discussed self-management of symptoms.  Outlined signs and symptoms indicating need for more acute intervention.  Patient verbalized understanding and all questions were answered.  Patient received  an Scientific laboratory technician.    Orders Placed This Encounter  Procedures  . H. pylori antibody, IgG  . TSH  . T4, free  . T3, free  . B12  . Vitamin D (25 hydroxy)  . CBC w/Diff  . Comp Met (CMET)     Note is dictated utilizing voice recognition software. Although note has been proof read prior to signing, occasional typographical errors still can be missed. If any questions arise, please do not hesitate to call for verification.   electronically signed by:  Howard Pouch, DO  [place]

## 2018-09-27 ENCOUNTER — Encounter: Payer: Self-pay | Admitting: Internal Medicine

## 2019-07-02 ENCOUNTER — Encounter: Payer: Self-pay | Admitting: Internal Medicine

## 2019-07-21 ENCOUNTER — Ambulatory Visit (AMBULATORY_SURGERY_CENTER): Payer: BLUE CROSS/BLUE SHIELD | Admitting: *Deleted

## 2019-07-21 ENCOUNTER — Other Ambulatory Visit: Payer: Self-pay

## 2019-07-21 VITALS — Temp 97.5°F | Ht 69.0 in | Wt 154.0 lb

## 2019-07-21 DIAGNOSIS — Z86010 Personal history of colonic polyps: Secondary | ICD-10-CM

## 2019-07-21 DIAGNOSIS — Z1159 Encounter for screening for other viral diseases: Secondary | ICD-10-CM

## 2019-07-21 MED ORDER — SUPREP BOWEL PREP KIT 17.5-3.13-1.6 GM/177ML PO SOLN: 1.0000 | Freq: Once | ORAL | 0 refills | Status: AC

## 2019-07-21 NOTE — Progress Notes (Signed)
No egg or soy allergy known to patient  No issues with past sedation with any surgeries  or procedures, no intubation problems  No diet pills per patient No home 02 use per patient  No blood thinners per patient  Pt denies issues with constipation - pt has on and off constipation- alt with constipation and loose stools- no OTC use of meds for this- she states diet related  No A fib or A flutter  EMMI video sent to pt's e mail   Due to the COVID-19 pandemic we are asking patients to follow these guidelines. Please only bring one care partner. Please be aware that your care partner may wait in the car in the parking lot or if they feel like they will be too hot to wait in the car, they may wait in the lobby on the 4th floor. All care partners are required to wear a mask the entire time (we do not have any that we can provide them), they need to practice social distancing, and we will do a Covid check for all patient's and care partners when you arrive. Also we will check their temperature and your temperature. If the care partner waits in their car they need to stay in the parking lot the entire time and we will call them on their cell phone when the patient is ready for discharge so they can bring the car to the front of the building. Also all patient's will need to wear a mask into building. Suprep $15

## 2019-08-04 ENCOUNTER — Encounter: Payer: Self-pay | Admitting: Internal Medicine

## 2019-08-05 ENCOUNTER — Other Ambulatory Visit: Payer: Self-pay | Admitting: Internal Medicine

## 2019-08-05 ENCOUNTER — Ambulatory Visit (INDEPENDENT_AMBULATORY_CARE_PROVIDER_SITE_OTHER): Payer: BC Managed Care – PPO

## 2019-08-05 DIAGNOSIS — Z1159 Encounter for screening for other viral diseases: Secondary | ICD-10-CM

## 2019-08-06 LAB — SARS CORONAVIRUS 2 (TAT 6-24 HRS): SARS Coronavirus 2: NEGATIVE

## 2019-08-08 ENCOUNTER — Ambulatory Visit (AMBULATORY_SURGERY_CENTER): Payer: BC Managed Care – PPO | Admitting: Internal Medicine

## 2019-08-08 ENCOUNTER — Other Ambulatory Visit: Payer: Self-pay

## 2019-08-08 ENCOUNTER — Encounter: Payer: Self-pay | Admitting: Internal Medicine

## 2019-08-08 VITALS — BP 115/79 | HR 74 | Temp 98.2°F | Resp 32 | Ht 69.0 in | Wt 154.0 lb

## 2019-08-08 DIAGNOSIS — D122 Benign neoplasm of ascending colon: Secondary | ICD-10-CM

## 2019-08-08 DIAGNOSIS — Z86010 Personal history of colonic polyps: Secondary | ICD-10-CM | POA: Diagnosis present

## 2019-08-08 DIAGNOSIS — D123 Benign neoplasm of transverse colon: Secondary | ICD-10-CM

## 2019-08-08 MED ORDER — SODIUM CHLORIDE 0.9 % IV SOLN: 500.0000 mL | Freq: Once | INTRAVENOUS | Status: DC

## 2019-08-08 NOTE — Progress Notes (Signed)
Pt tolerated well. VSS. Awake and to recovery. 

## 2019-08-08 NOTE — Op Note (Signed)
[place] Patient Name: Jorden Pendergast Procedure Date: 08/08/2019 8:33 AM MRN: [id number] Endoscopist: Jerene Bears , MD Age: 49 Referring MD:  Date of Birth: Feb 26, 1971 Gender: Female Account #: [id number] Procedure:                Colonoscopy Indications:              High risk colon cancer surveillance: Personal                            history of non-advanced adenoma, Last colonoscopy:                            January 2015 Medicines:                Monitored Anesthesia Care Procedure:                Pre-Anesthesia Assessment:                           - Prior to the procedure, a History and Physical                            was performed, and patient medications and                            allergies were reviewed. The patient's tolerance of                            previous anesthesia was also reviewed. The risks                            and benefits of the procedure and the sedation                            options and risks were discussed with the patient.                            All questions were answered, and informed consent                            was obtained. Prior Anticoagulants: The patient has                            taken no previous anticoagulant or antiplatelet                            agents. ASA Grade Assessment: II - A patient with                            mild systemic disease. After reviewing the risks                            and benefits, the patient was deemed in  satisfactory condition to undergo the procedure.                           After obtaining informed consent, the colonoscope                            was passed under direct vision. Throughout the                            procedure, the patient's blood pressure, pulse, and                            oxygen saturations were monitored continuously. The                            Colonoscope was introduced through the anus and                     advanced to the the cecum, identified by                            appendiceal orifice and ileocecal valve. The                            colonoscopy was performed without difficulty. The                            patient tolerated the procedure well. The quality                            of the bowel preparation was good. Scope In: 8:53:49 AM Scope Out: 9:09:02 AM Scope Withdrawal Time: 0 hours 12 minutes 58 seconds  Total Procedure Duration: 0 hours 15 minutes 13 seconds  Findings:                 Two sessile polyps were found in the ascending                            colon. The polyps were 4 to 5 mm in size. These                            polyps were removed with a cold snare. Resection                            and retrieval were complete.                           A 5 mm polyp was found in the transverse colon. The                            polyp was sessile. The polyp was removed with a                            cold snare. Resection and retrieval were complete.  External hemorrhoids were found during retroflexion.                           The exam was otherwise without abnormality. Complications:            No immediate complications. Estimated Blood Loss:     Estimated blood loss was minimal. Impression:               - Two 4 to 5 mm polyps in the ascending colon,                            removed with a cold snare. Resected and retrieved.                           - One 5 mm polyp in the transverse colon, removed                            with a cold snare. Resected and retrieved.                           - External hemorrhoids.                           - The examination was otherwise normal. Recommendation:           - Patient has a contact number available for                            emergencies. The signs and symptoms of potential                            delayed complications were discussed with the                             patient. Return to normal activities tomorrow.                            Written discharge instructions were provided to the                            patient.                           - Resume previous diet.                           - Continue present medications.                           - Await pathology results.                           - Repeat colonoscopy is recommended for                            surveillance. The colonoscopy date will be  determined after pathology results from today's                            exam become available for review. Jerene Bears, MD 08/08/2019 9:12:44 AM This report has been signed electronically.

## 2019-08-08 NOTE — Progress Notes (Signed)
Vitals-Corrina Temp-JB  Pt's states no medical or surgical changes since previsit or office visit.

## 2019-08-08 NOTE — Progress Notes (Signed)
Called to room to assist during endoscopic procedure.  Patient ID and intended procedure confirmed with present staff. Received instructions for my participation in the procedure from the performing physician.  

## 2019-08-08 NOTE — Patient Instructions (Signed)
YOU HAD AN ENDOSCOPIC PROCEDURE TODAY AT THE [facility]:   Refer to the procedure report that was given to you for any specific questions about what was found during the examination.  If the procedure report does not answer your questions, please call your gastroenterologist to clarify.  If you requested that your care partner not be given the details of your procedure findings, then the procedure report has been included in a sealed envelope for you to review at your convenience later.  YOU SHOULD EXPECT: Some feelings of bloating in the abdomen. Passage of more gas than usual.  Walking can help get rid of the air that was put into your GI tract during the procedure and reduce the bloating. If you had a lower endoscopy (such as a colonoscopy or flexible sigmoidoscopy) you may notice spotting of blood in your stool or on the toilet paper. If you underwent a bowel prep for your procedure, you may not have a normal bowel movement for a few days.  Please Note:  You might notice some irritation and congestion in your nose or some drainage.  This is from the oxygen used during your procedure.  There is no need for concern and it should clear up in a day or so.  SYMPTOMS TO REPORT IMMEDIATELY:   Following lower endoscopy (colonoscopy or flexible sigmoidoscopy):  Excessive amounts of blood in the stool  Significant tenderness or worsening of abdominal pains  Swelling of the abdomen that is new, acute  Fever of 100F or higher  For urgent or emergent issues, a gastroenterologist can be reached at any hour by calling [phone].   DIET:  We do recommend a small meal at first, but then you may proceed to your regular diet.  Drink plenty of fluids but you should avoid alcoholic beverages for 24 hours.  ACTIVITY:  You should plan to take it easy for the rest of today and you should NOT DRIVE or use heavy machinery until tomorrow (because of the sedation medicines used during the test).     FOLLOW UP: Our staff will call the number listed on your records 48-72 hours following your procedure to check on you and address any questions or concerns that you may have regarding the information given to you following your procedure. If we do not reach you, we will leave a message.  We will attempt to reach you two times.  During this call, we will ask if you have developed any symptoms of COVID 19. If you develop any symptoms (ie: fever, flu-like symptoms, shortness of breath, cough etc.) before then, please call [phone].  If you test positive for Covid 19 in the 2 weeks post procedure, please call and report this information to us.    If any biopsies were taken you will be contacted by phone or by letter within the next 1-3 weeks.  Please call us at [phone] if you have not heard about the biopsies in 3 weeks.    SIGNATURES/CONFIDENTIALITY: You and/or your care partner have signed paperwork which will be entered into your electronic medical record.  These signatures attest to the fact that that the information above on your After Visit Summary has been reviewed and is understood.  Full responsibility of the confidentiality of this discharge information lies with you and/or your care-partner. 

## 2019-08-12 ENCOUNTER — Telehealth: Payer: Self-pay | Admitting: *Deleted

## 2019-08-12 ENCOUNTER — Encounter: Payer: Self-pay | Admitting: Internal Medicine

## 2019-08-12 NOTE — Telephone Encounter (Signed)
  Follow up Call-  Call back number 08/08/2019  Post procedure Call Back phone  # [phone]  Permission to leave phone message Yes  Some recent data might be hidden     Patient questions:  Do you have a fever, pain , or abdominal swelling? No. Pain Score  0 *  Have you tolerated food without any problems? Yes.    Have you been able to return to your normal activities? Yes.    Do you have any questions about your discharge instructions: Diet   No. Medications  No. Follow up visit  No.  Do you have questions or concerns about your Care? No.  Actions: * If pain score is 4 or above: No action needed, pain <4.  1. Have you developed a fever since your procedure? no  2.   Have you had an respiratory symptoms (SOB or cough) since your procedure? no  3.   Have you tested positive for COVID 19 since your procedure no  4.   Have you had any family members/close contacts diagnosed with the COVID 19 since your procedure?  no   If yes to any of these questions please route to Joylene John, RN and Alphonsa Gin, Therapist, sports.

## 2020-02-09 ENCOUNTER — Other Ambulatory Visit: Payer: Self-pay | Admitting: Family Medicine

## 2020-02-09 DIAGNOSIS — N950 Postmenopausal bleeding: Secondary | ICD-10-CM

## 2020-02-20 ENCOUNTER — Ambulatory Visit
Admission: RE | Admit: 2020-02-20 | Discharge: 2020-02-20 | Disposition: A | Discharge status: Home / Self Care | Payer: BC Managed Care – PPO | Source: Ambulatory Visit | Attending: Family Medicine | Admitting: Family Medicine

## 2020-02-20 DIAGNOSIS — N950 Postmenopausal bleeding: Secondary | ICD-10-CM

## 2020-04-23 ENCOUNTER — Ambulatory Visit: Payer: Self-pay | Admitting: Cardiology

## 2021-04-14 IMAGING — US US PELVIS COMPLETE WITH TRANSVAGINAL
1 series · 13 of 25 positions shown · non-contrast
Comparison: 01/03/2016

CLINICAL DATA: Post menopausal bleeding

EXAM:
TRANSABDOMINAL AND TRANSVAGINAL ULTRASOUND OF PELVIS
TECHNIQUE: Both transabdominal and transvaginal ultrasound examinations of the
pelvis were performed. Transabdominal technique was performed for
global imaging of the pelvis including uterus, ovaries, adnexal
regions, and pelvic cul-de-sac. It was necessary to proceed with
endovaginal exam following the transabdominal exam to visualize the
uterus endometrium ovaries.

[Series 1: us pelvis complete with transvaginal · 0.28mm/px · 13 of 45 slices shown]
[im 1/45]
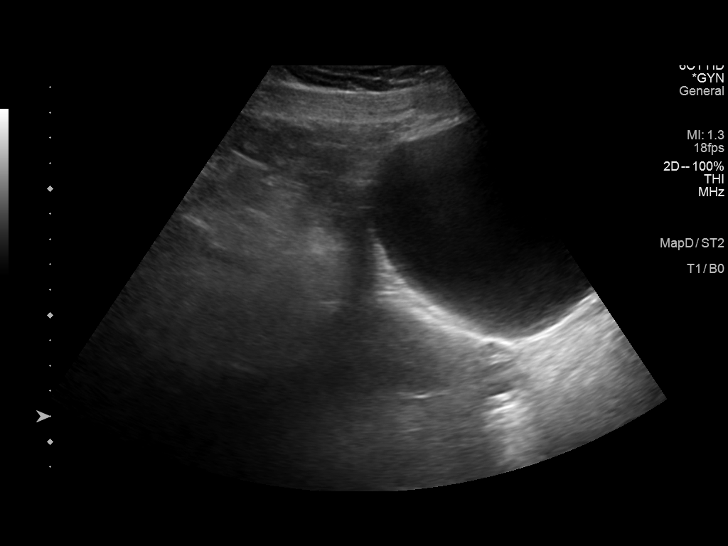
[im 4/45]
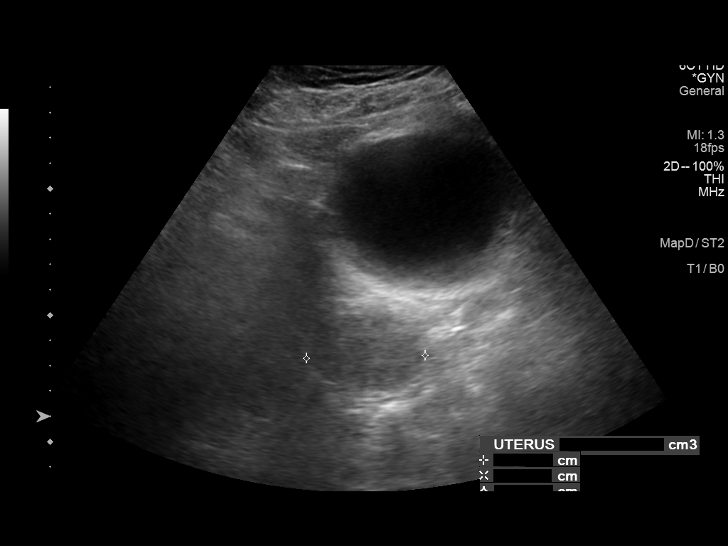
[im 8/45]
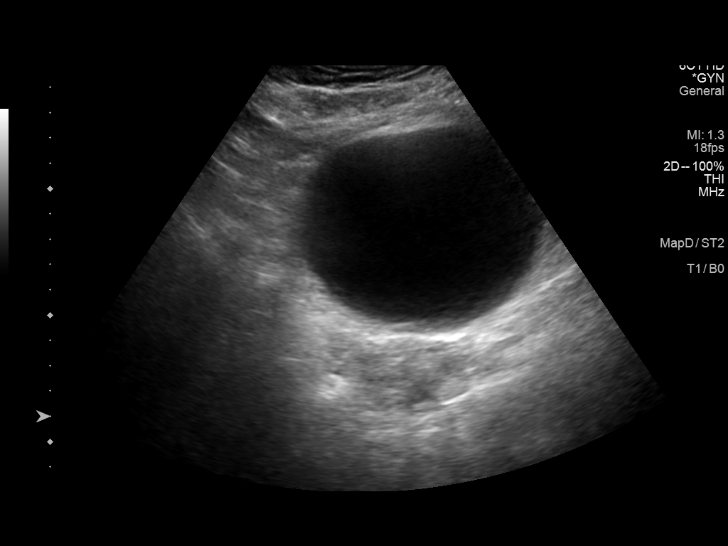
[im 12/45]
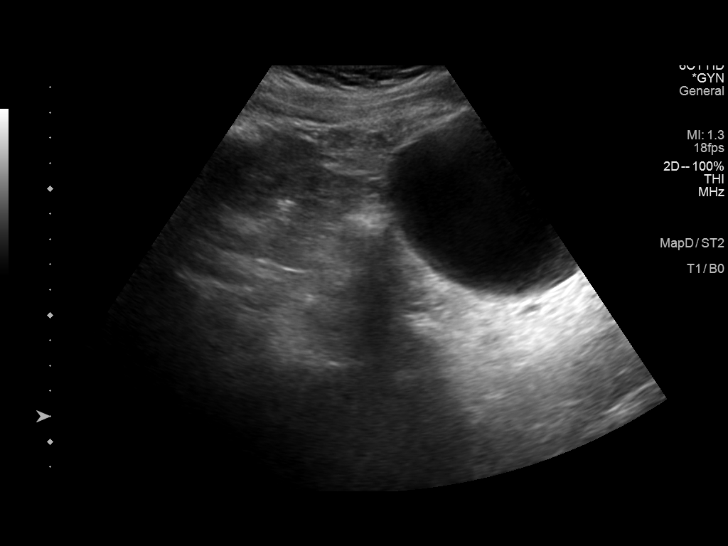
[im 15/45]
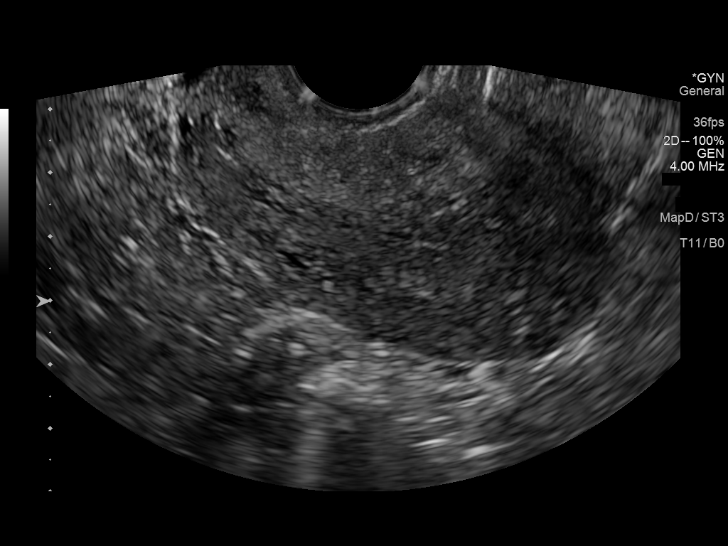
[im 19/45]
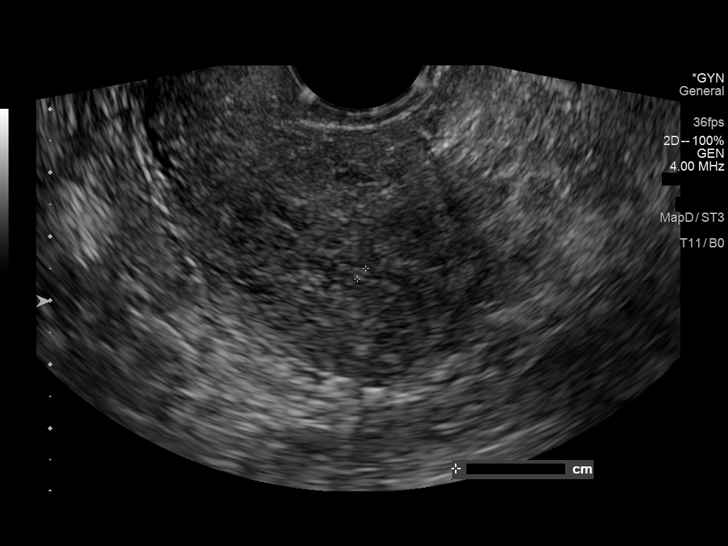
[im 23/45]
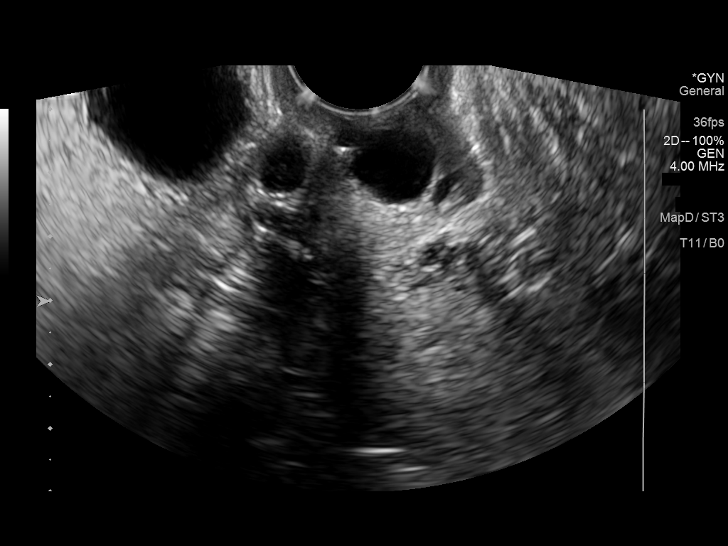
[im 26/45]
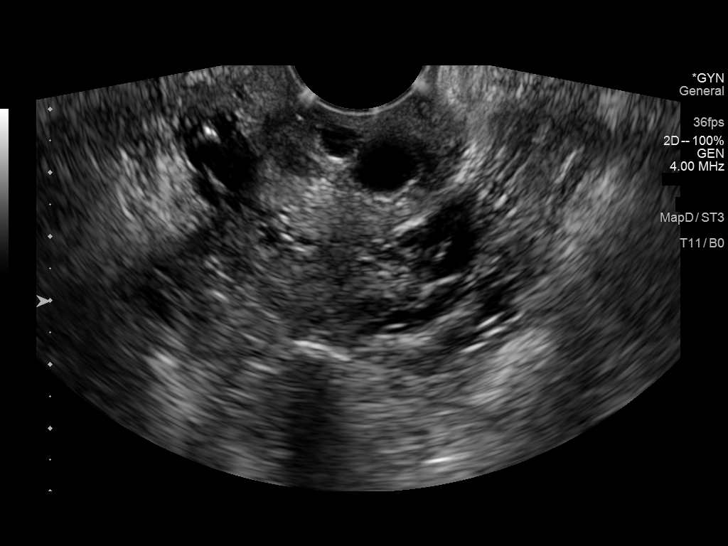
[im 30/45]
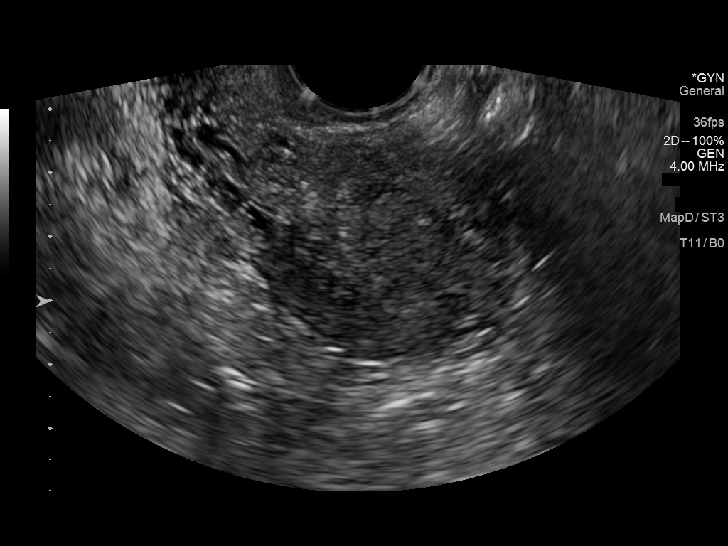
[im 34/45]
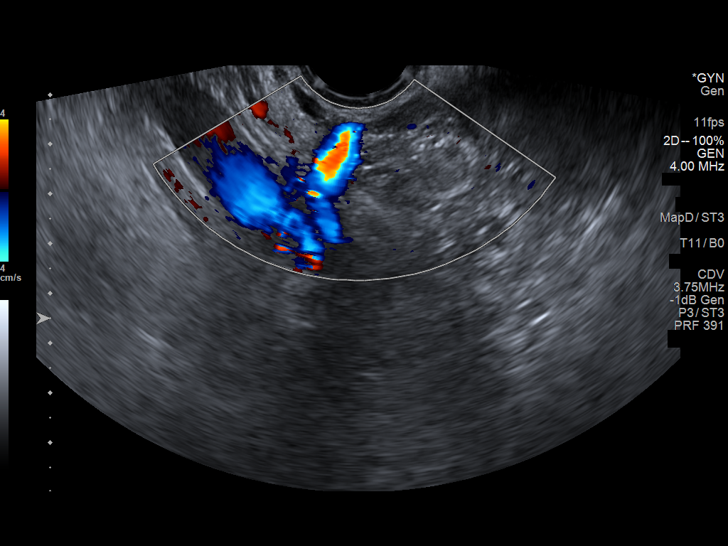
[im 37/45]
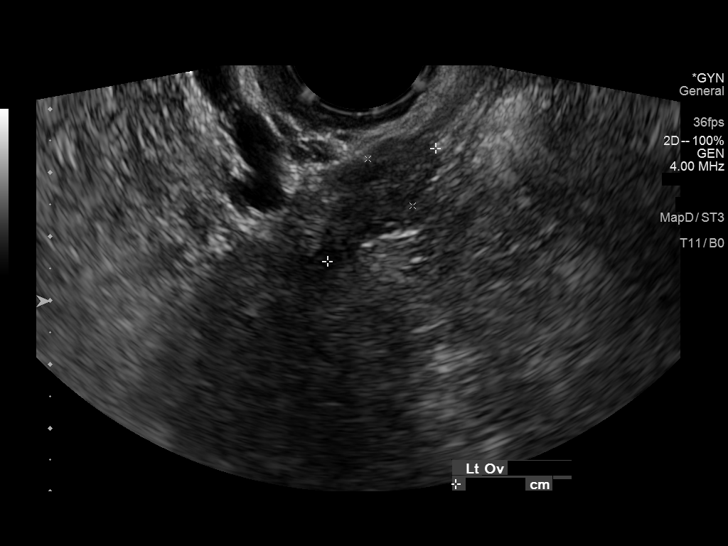
[im 41/45]
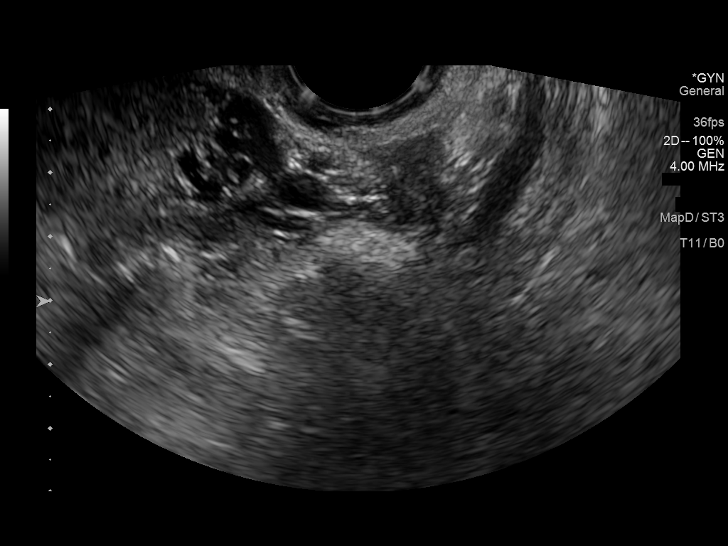
[im 45/45]
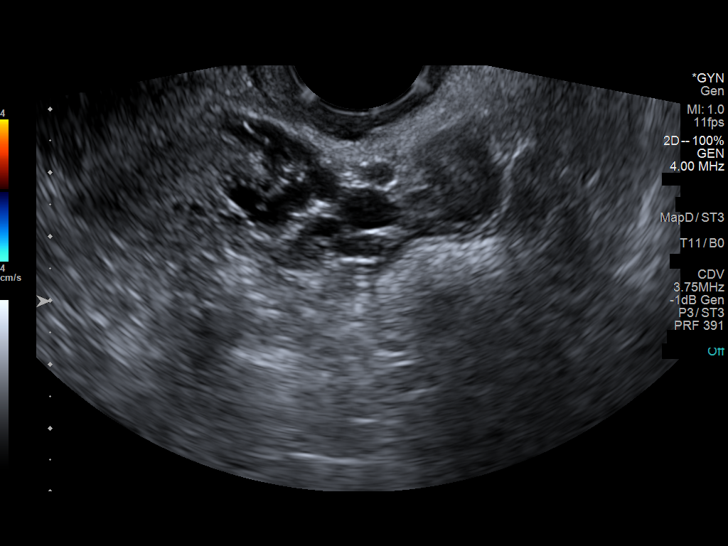

[13 of 25 positions shown; findings below may reference images not displayed]

FINDINGS: Uterus

Measurements: 8.3 x 3.5 x 4.7 cm = volume: 71.5 mL. No fibroids or
other mass visualized.

Endometrium

Thickness: 2.1 mm.  No focal abnormality visualized.

Right ovary

Not seen

Left ovary

Measurements: 2.5 x 1 x 1 cm = volume: 1.3 mL. Normal appearance/no
adnexal mass.

Other findings

No abnormal free fluid.
IMPRESSION: 1. Endometrial thickness of 2.1 mm. In the setting of
post-menopausal bleeding, this is consistent with a benign etiology
such as endometrial atrophy. If bleeding remains unresponsive to
hormonal or medical therapy, sonohysterogram should be considered
for focal lesion work-up. (Ref: Radiological Reasoning: Algorithmic
Workup of Abnormal Vaginal Bleeding with Endovaginal Sonography and
Sonohysterography. AJR [id number]; 191:S68-73)
2. Nonvisualized right ovary

## 2022-09-28 ENCOUNTER — Encounter: Payer: Self-pay | Admitting: Internal Medicine
